# Patient Record
Sex: Male | Born: 1935 | Race: White | Hispanic: No | Marital: Married | Smoking: Former smoker
Health system: Southern US, Community
[De-identification: ages and names within clinical notes are randomized; demographics above are authoritative.]

## PROBLEM LIST (undated history)

## (undated) DIAGNOSIS — J449 Chronic obstructive pulmonary disease, unspecified: Secondary | ICD-10-CM

## (undated) DIAGNOSIS — I10 Essential (primary) hypertension: Secondary | ICD-10-CM

## (undated) DIAGNOSIS — E119 Type 2 diabetes mellitus without complications: Secondary | ICD-10-CM

---

## 2015-04-27 ENCOUNTER — Other Ambulatory Visit: Payer: Self-pay | Admitting: Nephrology

## 2015-04-27 DIAGNOSIS — N183 Chronic kidney disease, stage 3 unspecified: Secondary | ICD-10-CM

## 2015-04-27 DIAGNOSIS — N179 Acute kidney failure, unspecified: Secondary | ICD-10-CM

## 2015-05-04 ENCOUNTER — Ambulatory Visit: Payer: Self-pay

## 2018-05-19 ENCOUNTER — Other Ambulatory Visit: Payer: Self-pay

## 2018-05-19 ENCOUNTER — Emergency Department (HOSPITAL_COMMUNITY): Payer: Medicare Other

## 2018-05-19 ENCOUNTER — Inpatient Hospital Stay (HOSPITAL_COMMUNITY)
Admission: EM | Admit: 2018-05-19 | Discharge: 2018-05-24 | DRG: 291 | Disposition: A | Discharge status: Hospice | Payer: Medicare Other | Attending: Internal Medicine | Admitting: Internal Medicine

## 2018-05-19 ENCOUNTER — Inpatient Hospital Stay (HOSPITAL_COMMUNITY): Payer: Medicare Other

## 2018-05-19 ENCOUNTER — Encounter (HOSPITAL_COMMUNITY): Payer: Self-pay | Admitting: Adult Health

## 2018-05-19 DIAGNOSIS — E872 Acidosis, unspecified: Secondary | ICD-10-CM | POA: Diagnosis present

## 2018-05-19 DIAGNOSIS — Z993 Dependence on wheelchair: Secondary | ICD-10-CM | POA: Diagnosis not present

## 2018-05-19 DIAGNOSIS — J9622 Acute and chronic respiratory failure with hypercapnia: Secondary | ICD-10-CM | POA: Diagnosis present

## 2018-05-19 DIAGNOSIS — J9601 Acute respiratory failure with hypoxia: Secondary | ICD-10-CM

## 2018-05-19 DIAGNOSIS — E875 Hyperkalemia: Secondary | ICD-10-CM | POA: Diagnosis present

## 2018-05-19 DIAGNOSIS — J810 Acute pulmonary edema: Secondary | ICD-10-CM | POA: Diagnosis not present

## 2018-05-19 DIAGNOSIS — Z66 Do not resuscitate: Secondary | ICD-10-CM | POA: Diagnosis present

## 2018-05-19 DIAGNOSIS — Z23 Encounter for immunization: Secondary | ICD-10-CM

## 2018-05-19 DIAGNOSIS — J811 Chronic pulmonary edema: Secondary | ICD-10-CM

## 2018-05-19 DIAGNOSIS — I5043 Acute on chronic combined systolic (congestive) and diastolic (congestive) heart failure: Secondary | ICD-10-CM | POA: Diagnosis present

## 2018-05-19 DIAGNOSIS — N19 Unspecified kidney failure: Secondary | ICD-10-CM

## 2018-05-19 DIAGNOSIS — J9621 Acute and chronic respiratory failure with hypoxia: Secondary | ICD-10-CM | POA: Diagnosis present

## 2018-05-19 DIAGNOSIS — I2721 Secondary pulmonary arterial hypertension: Secondary | ICD-10-CM | POA: Diagnosis present

## 2018-05-19 DIAGNOSIS — K7290 Hepatic failure, unspecified without coma: Secondary | ICD-10-CM | POA: Diagnosis present

## 2018-05-19 DIAGNOSIS — Z7982 Long term (current) use of aspirin: Secondary | ICD-10-CM

## 2018-05-19 DIAGNOSIS — Z7189 Other specified counseling: Secondary | ICD-10-CM | POA: Diagnosis not present

## 2018-05-19 DIAGNOSIS — Z79899 Other long term (current) drug therapy: Secondary | ICD-10-CM

## 2018-05-19 DIAGNOSIS — R748 Abnormal levels of other serum enzymes: Secondary | ICD-10-CM | POA: Diagnosis not present

## 2018-05-19 DIAGNOSIS — Z87891 Personal history of nicotine dependence: Secondary | ICD-10-CM | POA: Diagnosis not present

## 2018-05-19 DIAGNOSIS — I2781 Cor pulmonale (chronic): Secondary | ICD-10-CM | POA: Diagnosis present

## 2018-05-19 DIAGNOSIS — E1122 Type 2 diabetes mellitus with diabetic chronic kidney disease: Secondary | ICD-10-CM | POA: Diagnosis present

## 2018-05-19 DIAGNOSIS — I5033 Acute on chronic diastolic (congestive) heart failure: Secondary | ICD-10-CM | POA: Diagnosis present

## 2018-05-19 DIAGNOSIS — E785 Hyperlipidemia, unspecified: Secondary | ICD-10-CM | POA: Diagnosis present

## 2018-05-19 DIAGNOSIS — J90 Pleural effusion, not elsewhere classified: Secondary | ICD-10-CM | POA: Diagnosis not present

## 2018-05-19 DIAGNOSIS — I130 Hypertensive heart and chronic kidney disease with heart failure and stage 1 through stage 4 chronic kidney disease, or unspecified chronic kidney disease: Secondary | ICD-10-CM | POA: Diagnosis present

## 2018-05-19 DIAGNOSIS — N183 Chronic kidney disease, stage 3 (moderate): Secondary | ICD-10-CM | POA: Diagnosis present

## 2018-05-19 DIAGNOSIS — Z7951 Long term (current) use of inhaled steroids: Secondary | ICD-10-CM

## 2018-05-19 DIAGNOSIS — J449 Chronic obstructive pulmonary disease, unspecified: Secondary | ICD-10-CM | POA: Diagnosis present

## 2018-05-19 DIAGNOSIS — Z515 Encounter for palliative care: Secondary | ICD-10-CM | POA: Diagnosis present

## 2018-05-19 DIAGNOSIS — Z9981 Dependence on supplemental oxygen: Secondary | ICD-10-CM

## 2018-05-19 DIAGNOSIS — Z9889 Other specified postprocedural states: Secondary | ICD-10-CM | POA: Diagnosis not present

## 2018-05-19 DIAGNOSIS — E119 Type 2 diabetes mellitus without complications: Secondary | ICD-10-CM

## 2018-05-19 DIAGNOSIS — M419 Scoliosis, unspecified: Secondary | ICD-10-CM | POA: Diagnosis present

## 2018-05-19 DIAGNOSIS — E118 Type 2 diabetes mellitus with unspecified complications: Secondary | ICD-10-CM | POA: Diagnosis not present

## 2018-05-19 DIAGNOSIS — N179 Acute kidney failure, unspecified: Secondary | ICD-10-CM | POA: Diagnosis present

## 2018-05-19 DIAGNOSIS — J918 Pleural effusion in other conditions classified elsewhere: Secondary | ICD-10-CM | POA: Diagnosis present

## 2018-05-19 DIAGNOSIS — I5023 Acute on chronic systolic (congestive) heart failure: Secondary | ICD-10-CM | POA: Diagnosis not present

## 2018-05-19 DIAGNOSIS — R7989 Other specified abnormal findings of blood chemistry: Secondary | ICD-10-CM | POA: Diagnosis present

## 2018-05-19 DIAGNOSIS — R188 Other ascites: Secondary | ICD-10-CM | POA: Diagnosis present

## 2018-05-19 DIAGNOSIS — Z7984 Long term (current) use of oral hypoglycemic drugs: Secondary | ICD-10-CM

## 2018-05-19 DIAGNOSIS — Z9119 Patient's noncompliance with other medical treatment and regimen: Secondary | ICD-10-CM

## 2018-05-19 DIAGNOSIS — I10 Essential (primary) hypertension: Secondary | ICD-10-CM | POA: Diagnosis not present

## 2018-05-19 DIAGNOSIS — R0603 Acute respiratory distress: Secondary | ICD-10-CM

## 2018-05-19 DIAGNOSIS — R778 Other specified abnormalities of plasma proteins: Secondary | ICD-10-CM | POA: Diagnosis present

## 2018-05-19 HISTORY — DX: Essential (primary) hypertension: I10

## 2018-05-19 HISTORY — DX: Type 2 diabetes mellitus without complications: E11.9

## 2018-05-19 HISTORY — DX: Chronic obstructive pulmonary disease, unspecified: J44.9

## 2018-05-19 LAB — CBC WITH DIFFERENTIAL/PLATELET
BAND NEUTROPHILS: 0 %
BASOS ABS: 0 10*3/uL (ref 0.0–0.1)
BLASTS: 0 %
Basophils Relative: 0 %
Eosinophils Absolute: 0 10*3/uL (ref 0.0–0.7)
Eosinophils Relative: 0 %
HEMATOCRIT: 43.4 % (ref 39.0–52.0)
Hemoglobin: 12.4 g/dL — ABNORMAL LOW (ref 13.0–17.0)
LYMPHS ABS: 1 10*3/uL (ref 0.7–4.0)
LYMPHS PCT: 7 %
MCH: 19.7 pg — AB (ref 26.0–34.0)
MCHC: 28.6 g/dL — ABNORMAL LOW (ref 30.0–36.0)
MCV: 68.8 fL — AB (ref 78.0–100.0)
METAMYELOCYTES PCT: 0 %
MONOS PCT: 1 %
Monocytes Absolute: 0.1 10*3/uL (ref 0.1–1.0)
Myelocytes: 0 %
Neutro Abs: 13.2 10*3/uL — ABNORMAL HIGH (ref 1.7–7.7)
Neutrophils Relative %: 92 %
PROMYELOCYTES RELATIVE: 0 %
Platelets: 252 10*3/uL (ref 150–400)
RBC: 6.31 MIL/uL — AB (ref 4.22–5.81)
RDW: 20.8 % — ABNORMAL HIGH (ref 11.5–15.5)
WBC: 14.3 10*3/uL — AB (ref 4.0–10.5)
nRBC: 0 /100 WBC

## 2018-05-19 LAB — I-STAT CG4 LACTIC ACID, ED: LACTIC ACID, VENOUS: 6.52 mmol/L — AB (ref 0.5–1.9)

## 2018-05-19 LAB — COMPREHENSIVE METABOLIC PANEL
ALK PHOS: 55 U/L (ref 38–126)
ALT: 18 U/L (ref 0–44)
AST: 24 U/L (ref 15–41)
Albumin: 3.8 g/dL (ref 3.5–5.0)
Anion gap: 17 — ABNORMAL HIGH (ref 5–15)
BILIRUBIN TOTAL: 0.9 mg/dL (ref 0.3–1.2)
BUN: 56 mg/dL — AB (ref 8–23)
CALCIUM: 8.8 mg/dL — AB (ref 8.9–10.3)
CO2: 20 mmol/L — ABNORMAL LOW (ref 22–32)
CREATININE: 1.76 mg/dL — AB (ref 0.61–1.24)
Chloride: 99 mmol/L (ref 98–111)
GFR, EST AFRICAN AMERICAN: 41 mL/min — AB (ref >60)
GFR, EST NON AFRICAN AMERICAN: 35 mL/min — AB (ref >60)
Glucose, Bld: 142 mg/dL — ABNORMAL HIGH (ref 70–99)
Potassium: 5.3 mmol/L — ABNORMAL HIGH (ref 3.5–5.1)
Sodium: 136 mmol/L (ref 135–145)
TOTAL PROTEIN: 6.5 g/dL (ref 6.5–8.1)

## 2018-05-19 LAB — GLUCOSE, CAPILLARY: GLUCOSE-CAPILLARY: 142 mg/dL — AB (ref 70–99)

## 2018-05-19 LAB — TROPONIN I
TROPONIN I: 0.03 ng/mL — AB (ref <0.03)
Troponin I: 0.03 ng/mL (ref <0.03)

## 2018-05-19 LAB — URINALYSIS, ROUTINE W REFLEX MICROSCOPIC
BILIRUBIN URINE: NEGATIVE
Glucose, UA: NEGATIVE mg/dL
HGB URINE DIPSTICK: NEGATIVE
KETONES UR: NEGATIVE mg/dL
Leukocytes, UA: NEGATIVE
NITRITE: NEGATIVE
Protein, ur: NEGATIVE mg/dL
SPECIFIC GRAVITY, URINE: 1.01 (ref 1.005–1.030)
pH: 5 (ref 5.0–8.0)

## 2018-05-19 LAB — BRAIN NATRIURETIC PEPTIDE: B Natriuretic Peptide: 1047 pg/mL — ABNORMAL HIGH (ref 0.0–100.0)

## 2018-05-19 LAB — LACTIC ACID, PLASMA: Lactic Acid, Venous: 4.8 mmol/L (ref 0.5–1.9)

## 2018-05-19 LAB — CBG MONITORING, ED: GLUCOSE-CAPILLARY: 147 mg/dL — AB (ref 70–99)

## 2018-05-19 LAB — PROCALCITONIN

## 2018-05-19 MED ORDER — METHYLPREDNISOLONE SODIUM SUCC 125 MG IJ SOLR
125.0000 mg | Freq: Once | INTRAMUSCULAR | Status: AC
  Administered 2018-05-19: 125 mg via INTRAVENOUS
  Filled 2018-05-19: qty 2

## 2018-05-19 MED ORDER — SODIUM CHLORIDE 0.9 % IV SOLN
INTRAVENOUS | Status: AC
  Filled 2018-05-19: qty 2

## 2018-05-19 MED ORDER — FUROSEMIDE 10 MG/ML IJ SOLN
INTRAMUSCULAR | Status: AC
  Filled 2018-05-19: qty 4

## 2018-05-19 MED ORDER — SODIUM CHLORIDE 0.9 % IV SOLN
2.0000 g | INTRAVENOUS | Status: DC
  Filled 2018-05-19: qty 2

## 2018-05-19 MED ORDER — METHYLPREDNISOLONE SODIUM SUCC 40 MG IJ SOLR: 40.0000 mg | Freq: Two times a day (BID) | INTRAMUSCULAR | Status: DC

## 2018-05-19 MED ORDER — CEFEPIME HCL 2 G IJ SOLR
2.0000 g | Freq: Once | INTRAMUSCULAR | Status: AC
  Administered 2018-05-19: 2 g via INTRAVENOUS
  Filled 2018-05-19: qty 2

## 2018-05-19 MED ORDER — PRAVASTATIN SODIUM 10 MG PO TABS
20.0000 mg | ORAL_TABLET | Freq: Every morning | ORAL | Status: DC
  Administered 2018-05-20 – 2018-05-24 (×5): 20 mg via ORAL
  Filled 2018-05-19: qty 2
  Filled 2018-05-19: qty 1
  Filled 2018-05-19 (×2): qty 2
  Filled 2018-05-19: qty 1
  Filled 2018-05-19 (×2): qty 2

## 2018-05-19 MED ORDER — SODIUM CHLORIDE 0.9 % IV BOLUS: 1000.0000 mL | Freq: Once | INTRAVENOUS | Status: DC

## 2018-05-19 MED ORDER — ONDANSETRON HCL 4 MG/2ML IJ SOLN: 4.0000 mg | Freq: Four times a day (QID) | INTRAMUSCULAR | Status: DC | PRN

## 2018-05-19 MED ORDER — ALBUTEROL (5 MG/ML) CONTINUOUS INHALATION SOLN
10.0000 mg/h | INHALATION_SOLUTION | RESPIRATORY_TRACT | Status: DC
Start: 2018-05-19 — End: 2018-05-19
  Administered 2018-05-19: 10 mg/h via RESPIRATORY_TRACT
  Filled 2018-05-19: qty 20

## 2018-05-19 MED ORDER — FUROSEMIDE 10 MG/ML IJ SOLN
40.0000 mg | INTRAMUSCULAR | Status: AC
  Administered 2018-05-19: 40 mg via INTRAVENOUS
  Filled 2018-05-19: qty 4

## 2018-05-19 MED ORDER — TRAMADOL HCL 50 MG PO TABS
100.0000 mg | ORAL_TABLET | Freq: Every evening | ORAL | Status: DC
  Filled 2018-05-19: qty 2

## 2018-05-19 MED ORDER — SODIUM CHLORIDE 0.9% FLUSH: 3.0000 mL | INTRAVENOUS | Status: DC | PRN

## 2018-05-19 MED ORDER — IPRATROPIUM-ALBUTEROL 0.5-2.5 (3) MG/3ML IN SOLN
3.0000 mL | Freq: Four times a day (QID) | RESPIRATORY_TRACT | Status: DC
  Administered 2018-05-20 – 2018-05-24 (×17): 3 mL via RESPIRATORY_TRACT
  Filled 2018-05-19 (×17): qty 3

## 2018-05-19 MED ORDER — VANCOMYCIN HCL 10 G IV SOLR
1250.0000 mg | INTRAVENOUS | Status: DC
  Filled 2018-05-19: qty 1250

## 2018-05-19 MED ORDER — GABAPENTIN 300 MG PO CAPS
300.0000 mg | ORAL_CAPSULE | Freq: Two times a day (BID) | ORAL | Status: DC
Start: 2018-05-19 — End: 2018-05-24
  Administered 2018-05-19 – 2018-05-24 (×10): 300 mg via ORAL
  Filled 2018-05-19 (×10): qty 1

## 2018-05-19 MED ORDER — INSULIN ASPART 100 UNIT/ML ~~LOC~~ SOLN: 0.0000 [IU] | Freq: Every day | SUBCUTANEOUS | Status: DC

## 2018-05-19 MED ORDER — PNEUMOCOCCAL VAC POLYVALENT 25 MCG/0.5ML IJ INJ
0.5000 mL | INJECTION | INTRAMUSCULAR | Status: AC
  Administered 2018-05-20: 0.5 mL via INTRAMUSCULAR
  Filled 2018-05-19: qty 0.5

## 2018-05-19 MED ORDER — ONDANSETRON HCL 4 MG PO TABS: 4.0000 mg | ORAL_TABLET | Freq: Four times a day (QID) | ORAL | Status: DC | PRN

## 2018-05-19 MED ORDER — ACETAMINOPHEN 325 MG PO TABS
650.0000 mg | ORAL_TABLET | Freq: Four times a day (QID) | ORAL | Status: DC | PRN
  Administered 2018-05-24: 650 mg via ORAL
  Filled 2018-05-19: qty 2

## 2018-05-19 MED ORDER — INSULIN ASPART 100 UNIT/ML ~~LOC~~ SOLN
0.0000 [IU] | SUBCUTANEOUS | Status: DC
  Administered 2018-05-20: 2 [IU] via SUBCUTANEOUS

## 2018-05-19 MED ORDER — INSULIN ASPART 100 UNIT/ML ~~LOC~~ SOLN: 0.0000 [IU] | SUBCUTANEOUS | Status: DC

## 2018-05-19 MED ORDER — HEPARIN SODIUM (PORCINE) 5000 UNIT/ML IJ SOLN
5000.0000 [IU] | Freq: Three times a day (TID) | INTRAMUSCULAR | Status: DC
  Administered 2018-05-19 – 2018-05-23 (×12): 5000 [IU] via SUBCUTANEOUS
  Filled 2018-05-19 (×12): qty 1

## 2018-05-19 MED ORDER — SODIUM CHLORIDE 0.9% FLUSH
3.0000 mL | Freq: Two times a day (BID) | INTRAVENOUS | Status: DC
  Administered 2018-05-20 – 2018-05-22 (×6): 3 mL via INTRAVENOUS

## 2018-05-19 MED ORDER — VANCOMYCIN HCL IN DEXTROSE 1-5 GM/200ML-% IV SOLN
1000.0000 mg | Freq: Once | INTRAVENOUS | Status: AC
  Administered 2018-05-19: 1000 mg via INTRAVENOUS
  Filled 2018-05-19: qty 200

## 2018-05-19 MED ORDER — ACETAMINOPHEN 650 MG RE SUPP: 650.0000 mg | Freq: Four times a day (QID) | RECTAL | Status: DC | PRN

## 2018-05-19 MED ORDER — SODIUM CHLORIDE 0.9 % IV SOLN: 250.0000 mL | INTRAVENOUS | Status: DC | PRN

## 2018-05-19 MED ORDER — ASPIRIN EC 325 MG PO TBEC
325.0000 mg | DELAYED_RELEASE_TABLET | Freq: Every day | ORAL | Status: DC
  Administered 2018-05-19 – 2018-05-23 (×5): 325 mg via ORAL
  Filled 2018-05-19 (×5): qty 1

## 2018-05-19 MED ORDER — FUROSEMIDE 10 MG/ML IJ SOLN
80.0000 mg | Freq: Two times a day (BID) | INTRAMUSCULAR | Status: DC
Start: 2018-05-20 — End: 2018-05-21
  Administered 2018-05-20 – 2018-05-21 (×2): 80 mg via INTRAVENOUS
  Filled 2018-05-19 (×4): qty 8

## 2018-05-19 MED ORDER — FUROSEMIDE 10 MG/ML IJ SOLN
40.0000 mg | Freq: Once | INTRAMUSCULAR | Status: AC
  Administered 2018-05-19: 40 mg via INTRAVENOUS

## 2018-05-19 MED ORDER — BUDESONIDE 0.25 MG/2ML IN SUSP
0.2500 mg | Freq: Two times a day (BID) | RESPIRATORY_TRACT | Status: DC
  Administered 2018-05-20 – 2018-05-24 (×9): 0.25 mg via RESPIRATORY_TRACT
  Filled 2018-05-19 (×9): qty 2

## 2018-05-19 NOTE — H&P (Addendum)
History and Physical    Chad Richard ZOX:[id number] DOB: October 09, 1936 DOA: 05/19/2018  [facility]   Patient coming from: Home  Chief Complaint: Dyspnea/hypoxemia  HPI: Chad Richard is a 82 y.o. male with medical history significant for COPD with prior tobacco use on home oxygen, questionable history of CHF, scoliosis and wheelchair-bound, diabetes, hypertension, and dyslipidemia who was brought to the ED by family members due to worsening shortness of breath and hypoxemia that appears to be ongoing since his prior hospitalization in [place] near the end of June.  At that time, reportedly he had bilateral thoracentesis with significant relief and apparently left AMA.  He has had worsening oxygen requirements and is now on 10 L nasal cannula at home consistently.  He cannot functionally do much of anything at home and was recently prescribed a steroid taper pack as well as doxycycline by his primary care provider and he is currently on the sixth day of treatment.  He is also been using home nebulizers along with these medications with no relief at all. He denies any chest pain, cough, hemoptysis, lower extremity edema, orthopnea, paroxysmal nocturnal dyspnea, fever, or chills.   ED Course: Vital signs are stable with laboratory data demonstrating leukocytosis of 14,300 and lactic acid initially of 6.52 with 4.8 on repeat.  Hemoglobin is 12.4 and potassium 5.3.  BUN 56 and creatinine 1.76.  Troponin 0.03.  Two-view chest x-ray with pulmonary edema and bilateral pleural effusions noted.  EKG with sinus tachycardia at 90 bpm.  He has remained hypoxemic on nasal cannula and has been placed on BiPAP with FiO2 65% and appears to be tolerating this well.  He has received 1 dose of Lasix as well as IV steroids and breathing treatments along with initiation of vancomycin and cefepime.  Review of Systems: All others reviewed and otherwise negative.  Past Medical History:    Diagnosis Date  . COPD (chronic obstructive pulmonary disease) (HCC)   . Diabetes mellitus without complication (HCC)   . Hypertension     History reviewed. No pertinent surgical history.   reports that he has quit smoking. His smoking use included cigarettes. He does not have any smokeless tobacco history on file. He reports that he does not drink alcohol or use drugs.  No Known Allergies  History reviewed. No pertinent family history.  Prior to Admission medications   Medication Sig Start Date End Date Taking? Authorizing Provider  albuterol (PROAIR HFA) 108 (90 Base) MCG/ACT inhaler Inhale 1-2 puffs into the lungs every 6 (six) hours as needed for wheezing or shortness of breath.   Yes [provider]  albuterol (PROVENTIL) (2.5 MG/3ML) 0.083% nebulizer solution Take 2.5 mg by nebulization every 4 (four) hours.   Yes [provider]  amLODipine (NORVASC) 10 MG tablet Take 10 mg by mouth every morning.   Yes [provider]  aspirin EC 325 MG tablet Take 325 mg by mouth at bedtime.   Yes [provider]  Cetirizine-Pseudoephedrine (ALLERGY RELIEF/NASAL DECONGEST PO) Take 2 sprays by mouth every evening.   Yes [provider]  diphenhydrAMINE (BENADRYL) 25 MG tablet Take 50 mg by mouth at bedtime as needed for sleep.   Yes [provider]  doxycycline (VIBRAMYCIN) 100 MG capsule Take 100 mg by mouth every 12 (twelve) hours. 10 day course starting on 05/14/18   Yes [provider]  fluticasone furoate-vilanterol (BREO ELLIPTA) 200-25 MCG/INH AEPB Inhale 1 puff into the lungs every evening.  Yes [provider]  furosemide (LASIX) 40 MG tablet Take 40 mg by mouth every morning.   Yes [provider]  gabapentin (NEURONTIN) 300 MG capsule Take 300 mg by mouth 2 (two) times daily.   Yes [provider]  Melatonin 10 MG CAPS Take 10 mg by mouth at bedtime as needed (for sleep).   Yes [provider]  metFORMIN (GLUCOPHAGE) 1000 MG tablet Take 1,000 mg by mouth 2 (two) times daily with a meal.   Yes [provider]  pravastatin (PRAVACHOL) 20 MG tablet Take 20 mg by mouth every morning.   Yes [provider]  predniSONE (DELTASONE) 20 MG tablet Take 20 mg by mouth See admin instructions. Starting on 05/14/2018 Take 3 tablets by mouth once, then take one tablet twice daily for 5 days, then stop   Yes [provider]  traMADol (ULTRAM) 50 MG tablet Take 100 mg by mouth every evening. *May take 100mg  every 8 hours as needed for pain   Yes [provider]    Physical Exam: Vitals:   05/19/18 1954 05/19/18 2000 05/19/18 2030 05/19/18 2046  BP: 126/78 115/79 117/69   Pulse: 92 90 90   Resp: 16 10 (!) 23   Temp:      TempSrc:      SpO2: 92% 96% 100% 99%  Weight:      Height:        Constitutional: Minimal distress and currently comfortable on BiPAP. Vitals:   05/19/18 1954 05/19/18 2000 05/19/18 2030 05/19/18 2046  BP: 126/78 115/79 117/69   Pulse: 92 90 90   Resp: 16 10 (!) 23   Temp:      TempSrc:      SpO2: 92% 96% 100% 99%  Weight:      Height:       Eyes: lids and conjunctivae normal ENMT: Mucous membranes are moist.  Neck: normal, supple Respiratory: clear to auscultation bilaterally. Normal respiratory effort. No accessory muscle use.  On BiPAP with FiO2 65%. Cardiovascular: Regular rate and rhythm, no murmurs. No extremity edema. Abdomen: no tenderness, no distention. Bowel sounds positive.  Musculoskeletal:  No joint deformity upper and lower extremities.   Skin: no rashes, lesions, ulcers.   Labs on Admission: I have personally reviewed following labs and imaging studies  CBC: Recent Labs  Lab 05/19/18 1806  WBC 14.3*  NEUTROABS 13.2*  HGB 12.4*  HCT 43.4  MCV 68.8*  PLT 252   Basic Metabolic Panel: Recent Labs  Lab 05/19/18 1806  NA 136  K 5.3*  CL 99  CO2 20*  GLUCOSE 142*  BUN 56*  CREATININE  1.76*  CALCIUM 8.8*   GFR: Estimated Creatinine Clearance: 39.6 mL/min (A) (by C-G formula based on SCr of 1.76 mg/dL (H)). Liver Function Tests: Recent Labs  Lab 05/19/18 1806  AST 24  ALT 18  ALKPHOS 55  BILITOT 0.9  PROT 6.5  ALBUMIN 3.8   No results for input(s): LIPASE, AMYLASE in the last 168 hours. No results for input(s): AMMONIA in the last 168 hours. Coagulation Profile: No results for input(s): INR, PROTIME in the last 168 hours. Cardiac Enzymes: Recent Labs  Lab 05/19/18 1806  TROPONINI 0.03*   BNP (last 3 results) No results for input(s): PROBNP in the last 8760 hours. HbA1C: No results for input(s): HGBA1C in the last 72 hours. CBG: No results for input(s): GLUCAP in the last 168 hours. Lipid Profile: No results for input(s): CHOL, HDL, LDLCALC,  TRIG, CHOLHDL, LDLDIRECT in the last 72 hours. Thyroid Function Tests: No results for input(s): TSH, T4TOTAL, FREET4, T3FREE, THYROIDAB in the last 72 hours. Anemia Panel: No results for input(s): VITAMINB12, FOLATE, FERRITIN, TIBC, IRON, RETICCTPCT in the last 72 hours. Urine analysis: No results found for: COLORURINE, APPEARANCEUR, LABSPEC, PHURINE, GLUCOSEU, HGBUR, BILIRUBINUR, KETONESUR, PROTEINUR, UROBILINOGEN, NITRITE, LEUKOCYTESUR  Radiological Exams on Admission: Dg Chest 2 View  Result Date: 05/19/2018 CLINICAL DATA:  82 year old male with a history of shortness of breath EXAM: CHEST - 2 VIEW COMPARISON:  None. FINDINGS: Cardiomediastinal silhouette borderline enlarged. Evidence of central vascular congestion. Interlobular septal thickening bilaterally. Blunting of the left costophrenic angle and right costophrenic angle. Meniscus on the lateral view within the costophrenic sulcus. No displaced fracture. IMPRESSION: Evidence of pulmonary edema and small bilateral pleural effusions. Electronically Signed   By: Gilmer Mor D.O.   On: 05/19/2018 19:30    EKG: Independently reviewed. NSR with low voltage  90bpm.  Assessment/Plan Principal Problem:   Acute hypoxemic respiratory failure (HCC) Active Problems:   CHF exacerbation (HCC)   Lactic acidosis   COPD (chronic obstructive pulmonary disease) (HCC)   Essential hypertension   Diabetes (HCC)   Dyslipidemia   AKI (acute kidney injury) (HCC)   Hyperkalemia   Elevated troponin    1. Acute hypoxemic respiratory failure likely secondary to cardiogenic pulmonary edema.  Maintain on BiPAP and monitor in stepdown unit.  Order chest CT for now for further evaluation and consider VQ scan due to poor renal function to evaluate for pulmonary embolus if there appear to be no significant findings to explain severe hypoxemia. Recheck am abg. 2. Acute CHF exacerbation.  Lasix 80 mg IV twice daily as he does not appear to have responded well to 40 mg of IV Lasix in the ED.  Patient reportedly is compliant with his home oral Lasix of 40 mg daily but has been having poor urine output.  Continue to monitor ins and outs and daily weights.  2D echocardiogram for further evaluation.  Patient does not appear to be on any beta-blockers and will hold ACE inhibitors due to renal dysfunction.  Patient has apparently recently had a cardiac catheterization at [facility].  Will order chart for review. 3. Lactic acidosis-improving.  Patient does have some mild leukocytosis but this is likely secondary to his recent steroid taper use.  He currently has no clinical signs or symptoms of infection and no other significant SIRs criteria.  We will continue vancomycin and cefepime empirically for now but he does not appear to have pneumonia.  Blood cultures have been obtained in ED, however patient has been on doxycycline over the last 1 week.  Will order procalcitonin with repeat lactic acid in a.m.  Hold IV fluid for now as this is likely related to CHF exacerbation.  Order UA and urine culture when patient produces urine. 4. AKI versus CKD.  Continue to monitor with repeat  renal panel in a.m. and avoid nephrotoxic agents aside from diuresis.  Monitor strict input and outputs and consider nephrology consultation should patient have poor urine output despite diuretics as ordered. 5. Elevated troponin.  Likely due to demand from above processes.  Continue to trend.  Check 2D echocardiogram. 6. Hyperkalemia.  Will maintain on Lasix for now and recheck renal panel.  Monitor on telemetry. 7. COPD on home intermittent oxygen.  He does not appear to be in any acute COPD exacerbation, but will place on IV steroids at 60 mg every 6  hours empirically as well as duo nebs and Pulmicort. 8. Type 2 diabetes.  Hold home metformin and place on sliding scale insulin. 9. Essential hypertension.  Hold home amlodipine at this time due to plans for aggressive diuresis and borderline blood pressure readings. 10. Dyslipidemia.  Continue statin.  Addendum: Patient noted to have moderate right-sided pleural effusion and small left pleural effusion.  Will order ultrasound guided thoracentesis of right-sided effusion for a.m.  DVT prophylaxis: Heparin Code Status: Full Family Communication: 2 sons, daughter, and wife at bedside Disposition Plan:Further evaluation of dyspnea/hypoxemia with initial empiric treatment Consults called:None Admission status: Inpatient, SDU   Chad Richard Hoover Brunette DO [facility] Pager [phone]  If 7PM-7AM, please contact night-coverage [email or web address] Password [email or web address]  05/19/2018, 9:02 PM

## 2018-05-19 NOTE — Progress Notes (Signed)
Patient transported to CT then to ICU bed 7 on BIPAP. Unit plugged into red outlet and O2 hooked up to wall upon arrival to ICU. No complications noted. Report given to K. Wyatt RRT,RCP.

## 2018-05-19 NOTE — Progress Notes (Signed)
Pharmacy Antibiotic Note  Chad Richard is a 82 y.o. male admitted on 05/19/2018 with sepsis.  Pharmacy has been consulted for Vancomycin and Cefepime dosing.  Initial doses given in ED.  Plan: Vancomycin 1250mg  IV every 24 hours.  Goal trough 15-20 mcg/mL.  Cefepime 2gm IV every 24 hours. Monitor labs, micro and vitals.   Height: 6\' 2"  (188 cm) Weight: 200 lb (90.7 kg) IBW/kg (Calculated) : 82.2  Temp (24hrs), Avg:98 F (36.7 C), Min:98 F (36.7 C), Max:98 F (36.7 C)  Recent Labs  Lab 05/19/18 1806 05/19/18 1811 05/19/18 1951  WBC 14.3*  --   --   CREATININE 1.76*  --   --   LATICACIDVEN  --  6.52* 4.8*    Estimated Creatinine Clearance: 39.6 mL/min (A) (by C-G formula based on SCr of 1.76 mg/dL (H)).    No Known Allergies  Antimicrobials this admission: Cefepime 7/17 >>  Vanc 7/17 >>   Dose adjustments this admission: n/a   Microbiology results: 7/17 BCx: pending 7/17 UCx: pending   Sputum:    MRSA PCR:   Thank you for allowing pharmacy to be a part of this patient's care.  Mady GemmaHayes, Llesenia Fogal R 05/19/2018 9:47 PM

## 2018-05-19 NOTE — ED Notes (Signed)
Dr. Hyacinth MeekerMiller advised of critical lactic

## 2018-05-19 NOTE — ED Provider Notes (Signed)
[facility] EMERGENCY DEPARTMENT Provider Note   CSN: [id number] Arrival date & time: 05/19/18  1739     History   Chief Complaint Chief Complaint  Patient presents with  . Shortness of Breath    HPI Chad Richard is a 82 y.o. male.  HPI  The patient is a 82 year old male who reportedly has a history of long-time tobacco use, long-time COPD, was found to be hypoxic at approximately 70% O2, has had worsening shortness of breath over the last several days. He does endorse having a chronic cough but denies fevers or significant swelling of the legs. He is very immobile secondary to his spinal scoliosis and advanced disease as well as his liver failure and recurrent ascites. He does get recurrent paracentesis for that. He reports that the shortness of breath is severe today prompting his evaluation. Paramedics found him to be severely hypoxic and gave 3 doses ofalbuterol with ipratropium in route with some improvement however his oxygen on arrival was 75%.  The patient endorses having prior admissions to the hospital for shortness of breath and COPD. He denies history of blood clot, denies frank chest discomfort at this time.  Past Medical History:  Diagnosis Date  . COPD (chronic obstructive pulmonary disease) (HCC)   . Diabetes mellitus without complication (HCC)   . Hypertension     There are no active problems to display for this patient.   History reviewed. No pertinent surgical history.      Home Medications    Prior to Admission medications   Medication Sig Start Date End Date Taking? Authorizing Provider  albuterol (PROAIR HFA) 108 (90 Base) MCG/ACT inhaler Inhale 1-2 puffs into the lungs every 6 (six) hours as needed for wheezing or shortness of breath.   Yes [provider]  albuterol (PROVENTIL) (2.5 MG/3ML) 0.083% nebulizer solution Take 2.5 mg by nebulization every 4 (four) hours.   Yes [provider]  amLODipine (NORVASC) 10 MG tablet Take 10  mg by mouth every morning.   Yes [provider]  aspirin EC 325 MG tablet Take 325 mg by mouth at bedtime.   Yes [provider]  Cetirizine-Pseudoephedrine (ALLERGY RELIEF/NASAL DECONGEST PO) Take 2 sprays by mouth every evening.   Yes [provider]  diphenhydrAMINE (BENADRYL) 25 MG tablet Take 50 mg by mouth at bedtime as needed for sleep.   Yes [provider]  doxycycline (VIBRAMYCIN) 100 MG capsule Take 100 mg by mouth every 12 (twelve) hours. 10 day course starting on 05/14/18   Yes [provider]  fluticasone furoate-vilanterol (BREO ELLIPTA) 200-25 MCG/INH AEPB Inhale 1 puff into the lungs every evening.   Yes [provider]  furosemide (LASIX) 40 MG tablet Take 40 mg by mouth every morning.   Yes [provider]  gabapentin (NEURONTIN) 300 MG capsule Take 300 mg by mouth 2 (two) times daily.   Yes [provider]  Melatonin 10 MG CAPS Take 10 mg by mouth at bedtime as needed (for sleep).   Yes [provider]  metFORMIN (GLUCOPHAGE) 1000 MG tablet Take 1,000 mg by mouth 2 (two) times daily with a meal.   Yes [provider]  pravastatin (PRAVACHOL) 20 MG tablet Take 20 mg by mouth every morning.   Yes [provider]  predniSONE (DELTASONE) 20 MG tablet Take 20 mg by mouth See admin instructions. Starting on 05/14/2018 Take 3 tablets by mouth once, then take one tablet twice daily for 5 days, then stop  Yes [provider]  traMADol (ULTRAM) 50 MG tablet Take 100 mg by mouth every evening. *May take 100mg  every 8 hours as needed for pain   Yes [provider]    Family History History reviewed. No pertinent family history.  Social History Social History   Tobacco Use  . Smoking status: Former Smoker    Types: Cigarettes  Substance Use Topics  . Alcohol use: Never    Frequency: Never  . Drug use: Never     Allergies   Patient has no known  allergies.   Review of Systems Review of Systems  All other systems reviewed and are negative.    Physical Exam Updated Vital Signs BP 115/79   Pulse 90   Temp 98 F (36.7 C) (Oral)   Resp 10   Ht 6\' 2"  (1.88 m)   Wt 90.7 kg (200 lb)   SpO2 96%   BMI 25.68 kg/m   Physical Exam  Constitutional: He appears well-developed and well-nourished. He appears ill. He appears distressed.  HENT:  Head: Normocephalic and atraumatic.  Mouth/Throat: Oropharynx is clear and moist. No oropharyngeal exudate.  MM look dry  Eyes: Pupils are equal, round, and reactive to light. Conjunctivae and EOM are normal. Right eye exhibits no discharge. Left eye exhibits no discharge. No scleral icterus.  Neck: Normal range of motion. Neck supple. No JVD present. No thyromegaly present.  Cardiovascular: Normal rate, regular rhythm, normal heart sounds and intact distal pulses. Exam reveals no gallop and no friction rub.  No murmur heard. Pulmonary/Chest: He is in respiratory distress. He has wheezes. He has no rales.  Lungs are diminished bilaterally - no obvious rales or wheezing, he is tachypneic and has some increased WOB  Abdominal: Soft. Bowel sounds are normal. He exhibits no distension and no mass. There is no tenderness.  Full abdomen - dull to percussion - no fluid wave, no ttp  Musculoskeletal: Normal range of motion. He exhibits edema ( bilatearl LE edema - leg length discrepancy). He exhibits no tenderness.  Lymphadenopathy:    He has no cervical adenopathy.  Neurological: He is alert. Coordination normal.  The patient is able to speak in sentences that are lucid, he has the ability to move both of his arms with normal strength. He has some difficulty with his legs secondary to weakness and chronic pain, he has no obvious facial droop or memory loss  Skin: Skin is warm and dry. No rash noted. No erythema.  Psychiatric: He has a normal mood and affect. His behavior is normal.  Nursing note and  vitals reviewed.    ED Treatments / Results  Labs (all labs ordered are listed, but only abnormal results are displayed) Labs Reviewed  BLOOD GAS, ARTERIAL - Abnormal; Notable for the following components:      Result Value   pCO2 arterial 31.7 (*)    pO2, Arterial 45.9 (*)    All other components within normal limits  CBC WITH DIFFERENTIAL/PLATELET - Abnormal; Notable for the following components:   WBC 14.3 (*)    RBC 6.31 (*)    Hemoglobin 12.4 (*)    MCV 68.8 (*)    MCH 19.7 (*)    MCHC 28.6 (*)    RDW 20.8 (*)    Neutro Abs 13.2 (*)    All other components within normal limits  COMPREHENSIVE METABOLIC PANEL - Abnormal; Notable for the following components:   Potassium 5.3 (*)    CO2 20 (*)  Glucose, Bld 142 (*)    BUN 56 (*)    Creatinine, Ser 1.76 (*)    Calcium 8.8 (*)    GFR calc non Af Amer 35 (*)    GFR calc Af Amer 41 (*)    Anion gap 17 (*)    All other components within normal limits  TROPONIN I - Abnormal; Notable for the following components:   Troponin I 0.03 (*)    All other components within normal limits  I-STAT CG4 LACTIC ACID, ED - Abnormal; Notable for the following components:   Lactic Acid, Venous 6.52 (*)    All other components within normal limits  CULTURE, BLOOD (ROUTINE X 2)  CULTURE, BLOOD (ROUTINE X 2)  URINALYSIS, ROUTINE W REFLEX MICROSCOPIC  BRAIN NATRIURETIC PEPTIDE  LACTIC ACID, PLASMA  LACTIC ACID, PLASMA  I-STAT CG4 LACTIC ACID, ED    EKG EKG Interpretation  Date/Time:  Wednesday May 19 2018 17:50:14 EDT Ventricular Rate:  93 PR Interval:    QRS Duration: 106 QT Interval:  347 QTC Calculation: 432 R Axis:   -99 Text Interpretation:  Sinus rhythm Low voltage, extremity and precordial leads Consider right ventricular hypertrophy Consider inferior infarct Abnormal lateral Q waves diffuse low voltage, no old tracing to compare Confirmed by Eber Hong ([id number]) on 05/19/2018 5:55:37 PM   Radiology Dg Chest 2  View  Result Date: 05/19/2018 CLINICAL DATA:  82 year old male with a history of shortness of breath EXAM: CHEST - 2 VIEW COMPARISON:  None. FINDINGS: Cardiomediastinal silhouette borderline enlarged. Evidence of central vascular congestion. Interlobular septal thickening bilaterally. Blunting of the left costophrenic angle and right costophrenic angle. Meniscus on the lateral view within the costophrenic sulcus. No displaced fracture. IMPRESSION: Evidence of pulmonary edema and small bilateral pleural effusions. Electronically Signed   By: Gilmer Mor D.O.   On: 05/19/2018 19:30    Procedures .Critical Care Performed by: Eber Hong, MD Authorized by: Eber Hong, MD   Critical care provider statement:    Critical care time (minutes):  35   Critical care time was exclusive of:  Separately billable procedures and treating other patients and teaching time   Critical care was necessary to treat or prevent imminent or life-threatening deterioration of the following conditions:  Respiratory failure and cardiac failure   Critical care was time spent personally by me on the following activities:  Blood draw for specimens, development of treatment plan with patient or surrogate, discussions with consultants, evaluation of patient's response to treatment, examination of patient, obtaining history from patient or surrogate, ordering and performing treatments and interventions, ordering and review of laboratory studies, ordering and review of radiographic studies, pulse oximetry, re-evaluation of patient's condition and review of old charts Comments:     The patient resented in respiratory distress with persistent hypoxia less than 80% despite high flow nasal cannula. He has evidence of CHF with pulmonary edema, small pleural effusions, low voltage on his EKG.  He has no fever, no hypotension to suggest sepsis as the source of his lactic acidosis.   (including critical care time)  Medications  Ordered in ED Medications  albuterol (PROVENTIL,VENTOLIN) solution continuous neb (10 mg/hr Nebulization New Bag/Given 05/19/18 1952)  vancomycin (VANCOCIN) IVPB 1000 mg/200 mL premix (has no administration in time range)  ceFEPIme (MAXIPIME) 2 g in sodium chloride 0.9 % 100 mL IVPB (2 g Intravenous New Bag/Given 05/19/18 2000)  sodium chloride 0.9 % with ceFEPIme (MAXIPIME) ADS Med (has no administration in time range)  sodium chloride  0.9 % bolus 1,000 mL (has no administration in time range)  methylPREDNISolone sodium succinate (SOLU-MEDROL) 125 mg/2 mL injection 125 mg (125 mg Intravenous Given 05/19/18 1958)  furosemide (LASIX) injection 40 mg (40 mg Intravenous Given 05/19/18 2012)     Initial Impression / Assessment and Plan / ED Course  I have reviewed the triage vital signs and the nursing notes.  Pertinent labs & imaging results that were available during my care of the patient were reviewed by me and considered in my medical decision making (see chart for details).    The cause of the patient's shortness of breath is noterribly clear though at this time I would suspect that this is in some way related to COPD. He reports that he has had multiple paracentesis in the past as well as thoracentesis in the past, he may have recurrent effusions at this time.  EKG is unremarkable showing only low voltage  Chest x-ray pending, labs pending.  The patient is critically ill, requiring BiPAP due to his poor oxygenation, this has improved his oxygenation significantly and he is now at 95% and much more rested. I discussed his care with Dr. Clelia Croft of the hospitalist service who will admit to a high level of care. The patient is critically ill.  Final Clinical Impressions(s) / ED Diagnoses   Final diagnoses:  Respiratory distress  Acute pulmonary edema (HCC)  Lactic acidosis  Renal failure, unspecified chronicity      Eber Hong, MD 05/19/18 2027

## 2018-05-19 NOTE — ED Notes (Signed)
Respiratory paged for Bipap

## 2018-05-19 NOTE — ED Triage Notes (Signed)
Presents with hx of severe COPD, he states he uses 10 liters of compressed oxygen at home and has been doing this for quite some time. SOB began a week ago and has worsened. He endorses abdominal swelling. Denies pedal edema. Given 3 albuterol/atrovent treatments in route by EMS. Pt has a swallow color and is cool and clammy. Bilateral inspiratory and expiratory wheezes. HE recently had 5 liters of fluid removed from abdomen 8 days ago.

## 2018-05-19 NOTE — ED Notes (Signed)
Date and time results received: 05/19/18 1921  (use smartphrase ".now" to insert current time)  Test: Troponin Critical Value: 0.03  Name of Provider Notified: Hyacinth MeekerMiller  Orders Received? Or Actions Taken?:

## 2018-05-20 ENCOUNTER — Inpatient Hospital Stay (HOSPITAL_COMMUNITY): Payer: Medicare Other

## 2018-05-20 ENCOUNTER — Encounter (HOSPITAL_COMMUNITY): Payer: Self-pay | Admitting: Primary Care

## 2018-05-20 DIAGNOSIS — Z7189 Other specified counseling: Secondary | ICD-10-CM

## 2018-05-20 DIAGNOSIS — E872 Acidosis: Secondary | ICD-10-CM

## 2018-05-20 DIAGNOSIS — E118 Type 2 diabetes mellitus with unspecified complications: Secondary | ICD-10-CM

## 2018-05-20 DIAGNOSIS — I10 Essential (primary) hypertension: Secondary | ICD-10-CM

## 2018-05-20 DIAGNOSIS — Z515 Encounter for palliative care: Secondary | ICD-10-CM

## 2018-05-20 DIAGNOSIS — J449 Chronic obstructive pulmonary disease, unspecified: Secondary | ICD-10-CM

## 2018-05-20 DIAGNOSIS — I5023 Acute on chronic systolic (congestive) heart failure: Secondary | ICD-10-CM

## 2018-05-20 LAB — CBC
HCT: 40.7 % (ref 39.0–52.0)
Hemoglobin: 11.9 g/dL — ABNORMAL LOW (ref 13.0–17.0)
MCH: 19.7 pg — AB (ref 26.0–34.0)
MCHC: 29.2 g/dL — AB (ref 30.0–36.0)
MCV: 67.3 fL — ABNORMAL LOW (ref 78.0–100.0)
PLATELETS: 225 10*3/uL (ref 150–400)
RBC: 6.05 MIL/uL — ABNORMAL HIGH (ref 4.22–5.81)
RDW: 20.8 % — ABNORMAL HIGH (ref 11.5–15.5)
WBC: 10.1 10*3/uL (ref 4.0–10.5)

## 2018-05-20 LAB — GLUCOSE, CAPILLARY
GLUCOSE-CAPILLARY: 145 mg/dL — AB (ref 70–99)
GLUCOSE-CAPILLARY: 144 mg/dL — AB (ref 70–99)
GLUCOSE-CAPILLARY: 124 mg/dL — AB (ref 70–99)
GLUCOSE-CAPILLARY: 147 mg/dL — AB (ref 70–99)
GLUCOSE-CAPILLARY: 162 mg/dL — AB (ref 70–99)

## 2018-05-20 LAB — GRAM STAIN

## 2018-05-20 LAB — BLOOD GAS, ARTERIAL
ACID-BASE EXCESS: 1.7 mmol/L (ref 0.0–2.0)
BICARBONATE: 26.3 mmol/L (ref 20.0–28.0)
DELIVERY SYSTEMS: POSITIVE
DRAWN BY: 28459
Expiratory PAP: 6
FIO2: 100
Inspiratory PAP: 12
O2 Saturation: 92.1 %
PCO2 ART: 32.4 mmHg (ref 32.0–48.0)
Patient temperature: 37
pH, Arterial: 7.496 — ABNORMAL HIGH (ref 7.350–7.450)
pO2, Arterial: 66 mmHg — ABNORMAL LOW (ref 83.0–108.0)

## 2018-05-20 LAB — BASIC METABOLIC PANEL
Anion gap: 13 (ref 5–15)
BUN: 56 mg/dL — AB (ref 8–23)
CALCIUM: 8.7 mg/dL — AB (ref 8.9–10.3)
CO2: 24 mmol/L (ref 22–32)
CREATININE: 1.62 mg/dL — AB (ref 0.61–1.24)
Chloride: 99 mmol/L (ref 98–111)
GFR calc Af Amer: 45 mL/min — ABNORMAL LOW (ref >60)
GFR calc non Af Amer: 39 mL/min — ABNORMAL LOW (ref >60)
Glucose, Bld: 168 mg/dL — ABNORMAL HIGH (ref 70–99)
Potassium: 4.8 mmol/L (ref 3.5–5.1)
SODIUM: 136 mmol/L (ref 135–145)

## 2018-05-20 LAB — BODY FLUID CELL COUNT WITH DIFFERENTIAL
Eos, Fluid: 0 %
Lymphs, Fluid: 98 %
Monocyte-Macrophage-Serous Fluid: 2 % — ABNORMAL LOW (ref 50–90)
Neutrophil Count, Fluid: 0 % (ref 0–25)
Other Cells, Fluid: REACTIVE %
Total Nucleated Cell Count, Fluid: 284 cu mm (ref 0–1000)

## 2018-05-20 LAB — GLUCOSE, PLEURAL OR PERITONEAL FLUID: Glucose, Fluid: 165 mg/dL

## 2018-05-20 LAB — HEMOGLOBIN A1C
HEMOGLOBIN A1C: 6.9 % — AB (ref 4.8–5.6)
Mean Plasma Glucose: 151.33 mg/dL

## 2018-05-20 LAB — PROTEIN, PLEURAL OR PERITONEAL FLUID

## 2018-05-20 LAB — MAGNESIUM: Magnesium: 2 mg/dL (ref 1.7–2.4)

## 2018-05-20 LAB — LACTIC ACID, PLASMA: Lactic Acid, Venous: 2.5 mmol/L (ref 0.5–1.9)

## 2018-05-20 LAB — MRSA PCR SCREENING: MRSA by PCR: NEGATIVE

## 2018-05-20 LAB — TROPONIN I: TROPONIN I: 0.03 ng/mL — AB (ref <0.03)

## 2018-05-20 LAB — PROCALCITONIN: Procalcitonin: 0.1 ng/mL

## 2018-05-20 MED ORDER — ZOLPIDEM TARTRATE 5 MG PO TABS
5.0000 mg | ORAL_TABLET | Freq: Once | ORAL | Status: AC
  Administered 2018-05-20: 5 mg via ORAL
  Filled 2018-05-20: qty 1

## 2018-05-20 MED ORDER — INSULIN ASPART 100 UNIT/ML ~~LOC~~ SOLN
0.0000 [IU] | Freq: Three times a day (TID) | SUBCUTANEOUS | Status: DC
  Administered 2018-05-20 (×2): 2 [IU] via SUBCUTANEOUS

## 2018-05-20 MED ORDER — ORAL CARE MOUTH RINSE
15.0000 mL | Freq: Two times a day (BID) | OROMUCOSAL | Status: DC
  Administered 2018-05-20 – 2018-05-24 (×7): 15 mL via OROMUCOSAL

## 2018-05-20 NOTE — Progress Notes (Signed)
Dr. Laural BenesJohnson notified of patient declining treatment and patient's request to speak to him. Orders to give patient dose of lasix, contact family and inform pt that Dr. Laural BenesJohnson will be by to see him this afternoon.

## 2018-05-20 NOTE — Progress Notes (Signed)
Pt expressed that his biggest concern with taking lasix is that he does not want to be urinating the bed. Pt states he is agreeable to taking lasix only if he has a foley catheter placed. States he had one when he was at [facility]. Dr. Laural BenesJohnson notified.

## 2018-05-20 NOTE — Progress Notes (Signed)
Pt hollering out, attempting to remove bipap. Nurse removed bipap and placed on 15L HFNC. Pt saturating between 85% to 88%. Explained that we would have to place Bipap back on. States he does not want it. States, "I will rip that mask to pieces if you try to put it back on." Advised pt on the possibility of his breathing worsening and possibility of him needing to be intubated if he is not oxygenating well. States he is okay with that.

## 2018-05-20 NOTE — Consult Note (Signed)
Consultation Note Date: 05/20/2018   Patient Name: Chad Richard  DOB: November 06, 1935  MRN: [id number]  Age / Sex: 82 y.o., male  [facility] Referring Physician: Cleora FleetJohnson, Clanford L, MD  Reason for Consultation: Establishing goals of care and Psychosocial/spiritual support  HPI/Patient Profile: 82 y.o. male  with past medical history of CHF, COPD with home oxygen use (he states 10 L), high blood pressure and cholesterol, diabetes, admitted on 05/19/2018 with acute on chronic hypoxemic respiratory failure secondary to pulmonary edema and CHF.   Clinical Assessment and Goals of Care: Chad Richard is lying quietly in bed.  He greets me making and keeping eye contact.  He is calm and cooperative.  There is no family at bedside at this time. Chad Richard tells me of a decline over the last 3 years, sharing that he has been "bedridden".  He shares that he has been wheelchair-bound for 8 years.  He shares that he used to be able to get outside and do a few things, work around the house doing chores, but is no longer able.  He shares that his 3 adult children now live in their home to help.  His wife cares for him as she can, but he tells me that she is in the beginning stages of dementia.  Chad Richard tells me that he feels his breathing is better after removing fluid.  We talked about Pleurx drain, hospice services.  I shared that I have read nursing staff notes that sedate that if he is going to die, he prefers to return to his own home and have time there.  I share that we understand his desire, and will support his wishes.   We also talk about the BiPAP, Chad Richard states, "we settled that this morning".  I share that palliative medicine focuses on what he does and does not want, what is important to him.  I ask,  "you would not want the BiPAP, even if it meant you were to die without it?"  Chad Richard  considers this and states that he would like to use the BiPAP instead of passing.  We talked about preferred place of death, he states home.  I share my concern that sometimes when people come into the hospital, they become so sick and are never able to leave.  Chad Richard states that he has 3 dogs at home that he would like to spend time with.  We talked in detail about hospice, in-home services at no cost what is and is not provided.  I encourage Mr. Green to hear from them what they can and cannot do.  He is agreeable.  He states that he lives in [place] and is agreeable to [facility].  I share that we will follow-up tomorrow.  We talked about healthcare power of attorney, see below. We talked about advanced directives in detail, see below.   Healthcare power of attorney NEXT OF KIN -Chad Richard states that his wife is in the early  stages of dementia.  His desire is that his adult children be his healthcare surrogates.  He tells me that he believes they know what he does and does not want.  Mr. Chilton Si tells me he has 3 children living at home now to help with him including daughter Chad Richard, and sons Chad Richard and toning.  He also has a daughter Chad Richard living in a [place] who is battling cancer, they have also lost a child.   SUMMARY OF RECOMMENDATIONS   At this point, continue full scope treatment. Agreeable to Pleurx drain if possible. Considering home with hospice for "treat the treatable" care. Considering CODE STATUS, states his son recommends no code.  Code Status/Advance Care Planning:  Full code -we discussed the concept of "treat the treatable".  We talked about the realities of CPR, chest compressions and intubation.  Symptom Management:   Per hospitalist, no additional needs at this time.  Palliative Prophylaxis:   Aspiration and Oral Care  Additional Recommendations (Limitations, Scope, Preferences):  Full Scope Treatment  Psycho-social/Spiritual:    Desire for further Chaplaincy support:yes  Additional Recommendations: Caregiving  Support/Resources and Education on Hospice  Prognosis:   < 6 months or less would not be surprising based on functional decline over the last few months, frailty, end-stage pulmonary disease.  Discharge Planning: To be determined, based on outcomes, patient and family desire.      Primary Diagnoses: Present on Admission: **None**   I have reviewed the medical record, interviewed the patient and family, and examined the patient. The following aspects are pertinent.  Past Medical History:  Diagnosis Date  . COPD (chronic obstructive pulmonary disease) (HCC)   . Diabetes mellitus without complication (HCC)   . Hypertension    Social History   Socioeconomic History  . Marital status: Married    Spouse name: Not on file  . Number of children: Not on file  . Years of education: Not on file  . Highest education level: Not on file  Occupational History  . Not on file  Social Needs  . Financial resource strain: Not on file  . Food insecurity:    Worry: Not on file    Inability: Not on file  . Transportation needs:    Medical: Not on file    Non-medical: Not on file  Tobacco Use  . Smoking status: Former Smoker    Types: Cigarettes  Substance and Sexual Activity  . Alcohol use: Never    Frequency: Never  . Drug use: Never  . Sexual activity: Not on file  Lifestyle  . Physical activity:    Days per week: Not on file    Minutes per session: Not on file  . Stress: Not on file  Relationships  . Social connections:    Talks on phone: Not on file    Gets together: Not on file    Attends religious service: Not on file    Active member of club or organization: Not on file    Attends meetings of clubs or organizations: Not on file    Relationship status: Not on file  Other Topics Concern  . Not on file  Social History Narrative  . Not on file   History reviewed. No pertinent family  history. Scheduled Meds: . aspirin EC  325 mg Oral QHS  . budesonide (PULMICORT) nebulizer solution  0.25 mg Nebulization BID  . furosemide  80 mg Intravenous Q12H  . gabapentin  300 mg Oral BID  . heparin  5,000 Units  Subcutaneous Q8H  . insulin aspart  0-15 Units Subcutaneous TID WC  . ipratropium-albuterol  3 mL Nebulization Q6H  . mouth rinse  15 mL Mouth Rinse BID  . pravastatin  20 mg Oral q morning - 10a  . sodium chloride flush  3 mL Intravenous Q12H   Continuous Infusions: . sodium chloride     PRN Meds:.sodium chloride, acetaminophen **OR** acetaminophen, ondansetron **OR** ondansetron (ZOFRAN) IV, sodium chloride flush Medications Prior to Admission:  Prior to Admission medications   Medication Sig Start Date End Date Taking? Authorizing Provider  albuterol (PROAIR HFA) 108 (90 Base) MCG/ACT inhaler Inhale 1-2 puffs into the lungs every 6 (six) hours as needed for wheezing or shortness of breath.   Yes [provider]  albuterol (PROVENTIL) (2.5 MG/3ML) 0.083% nebulizer solution Take 2.5 mg by nebulization every 4 (four) hours.   Yes [provider]  amLODipine (NORVASC) 10 MG tablet Take 10 mg by mouth every morning.   Yes [provider]  aspirin EC 325 MG tablet Take 325 mg by mouth at bedtime.   Yes [provider]  Cetirizine-Pseudoephedrine (ALLERGY RELIEF/NASAL DECONGEST PO) Take 2 sprays by mouth every evening.   Yes [provider]  diphenhydrAMINE (BENADRYL) 25 MG tablet Take 50 mg by mouth at bedtime as needed for sleep.   Yes [provider]  doxycycline (VIBRAMYCIN) 100 MG capsule Take 100 mg by mouth every 12 (twelve) hours. 10 day course starting on 05/14/18   Yes [provider]  fluticasone furoate-vilanterol (BREO ELLIPTA) 200-25 MCG/INH AEPB Inhale 1 puff into the lungs every evening.   Yes [provider]  furosemide (LASIX) 40 MG tablet Take 40 mg by mouth every morning.   Yes [provider]  gabapentin (NEURONTIN) 300 MG capsule Take 300 mg by mouth 2 (two) times daily.   Yes [provider]  Melatonin 10 MG CAPS Take 10 mg by mouth at bedtime as needed (for sleep).   Yes [provider]  metFORMIN (GLUCOPHAGE) 1000 MG tablet Take 1,000 mg by mouth 2 (two) times daily with a meal.   Yes [provider]  pravastatin (PRAVACHOL) 20 MG tablet Take 20 mg by mouth every morning.   Yes [provider]  predniSONE (DELTASONE) 20 MG tablet Take 20 mg by mouth See admin instructions. Starting on 05/14/2018 Take 3 tablets by mouth once, then take one tablet twice daily for 5 days, then stop   Yes [provider]  traMADol (ULTRAM) 50 MG tablet Take 100 mg by mouth every evening. *May take 100mg  every 8 hours as needed for pain   Yes [provider]   No Known Allergies Review of Systems  Unable to perform ROS: Age    Physical Exam  Constitutional: He is oriented to person, place, and time. He does not appear ill.  HENT:  Head: Atraumatic.  Cardiovascular: Normal rate.  Pulmonary/Chest: Effort normal. No respiratory distress.  Abdominal: Soft. He exhibits no distension. There is no guarding.  Musculoskeletal:       Right lower leg: He exhibits no edema.       Left lower leg: He exhibits no edema.  Neurological: He is alert and oriented to person, place, and time.  Skin: Skin is warm and dry.  Psychiatric: He has a normal mood and affect. His mood appears not anxious. He is not agitated.  Nursing note and vitals reviewed.   Vital Signs: BP 102/72   Pulse 86  Temp 98.1 F (36.7 C) (Oral)   Resp (!) 22   Ht 6\' 2"  (1.88 m)   Wt 96.9 kg (213 lb 10 oz)   SpO2 (!) 87%   BMI 27.43 kg/m  Pain Scale: 0-10   Pain Score: 0-No pain   SpO2: SpO2: (!) 87 % O2 Device:SpO2: (!) 87 % O2 Flow Rate: .O2 Flow Rate (L/min): 15 L/min  IO: Intake/output summary:   Intake/Output Summary (Last 24 hours) at 05/20/2018  1235 Last data filed at 05/20/2018 0951 Gross per 24 hour  Intake 423 ml  Output 2700 ml  Net -2277 ml    LBM:   Baseline Weight: Weight: 90.7 kg (200 lb) Most recent weight: Weight: 96.9 kg (213 lb 10 oz)     Palliative Assessment/Data:   Flowsheet Rows     Most Recent Value  Intake Tab  Referral Department  Hospitalist  Unit at Time of Referral  Intermediate Care Unit  Palliative Care Primary Diagnosis  Pulmonary  Date Notified  05/20/18  Palliative Care Type  New Palliative care  Reason for referral  Clarify Goals of Care  Date of Admission  05/19/18  Date first seen by Palliative Care  05/20/18  # of days Palliative referral response time  0 Day(s)  # of days IP prior to Palliative referral  1  Clinical Assessment  Palliative Performance Scale Score  40%  Pain Max last 24 hours  Not able to report  Pain Min Last 24 hours  Not able to report  Dyspnea Max Last 24 Hours  Not able to report  Dyspnea Min Last 24 hours  Not able to report  Psychosocial & Spiritual Assessment  Palliative Care Outcomes  Patient/Family meeting held?  Yes  Who was at the meeting?  Patient at bedside  Palliative Care Outcomes  Counseled regarding hospice, Provided advance care planning, Clarified goals of care      Time In: 0950 Time Out: 1040 Time Total: 50 minutes Greater than 50%  of this time was spent counseling and coordinating care related to the above assessment and plan.  Signed by: Katheran Awe, NP   Please contact Palliative Medicine Team phone at [phone] for questions and concerns.  For individual provider: See Loretha Stapler

## 2018-05-20 NOTE — Progress Notes (Signed)
Informed pt that Dr. Laural BenesJohnson will be by in to see him this afternoon. Educated on need for lasix to get fluid off of his lungs to allow for him to have an ease of breathing. Pt Still refusing lasix. States "I want to have a discussion about it." When asked what he wants to know/ discuss about, states "I want to talk to the doctor about it." pt does state he is agreeable to have thoracentesis performed. States the first 3 times he has had a thoracentesis it has seemed to help. Pt also states that he would rather not be hospitalized anymore. States, "I have been laying in bed for the past 3 weeks now and I figure if I am gonna die, I'd rather die in bed and home." Asked pt if he would be open to speaking to either our Chaplain or Palliative. Pt states yes he is.   Dr. Laural BenesJohnson notified

## 2018-05-20 NOTE — Progress Notes (Signed)
PROGRESS NOTE Chad Richard  ZOX:[id number]  DOB: Jan 01, 1936  DOA: 05/19/2018 PCP: [facility]  Brief Admission Hx: Chad Richard is a 82 y.o. male with medical history significant for COPD with prior tobacco use on home oxygen, questionable history of CHF, scoliosis and wheelchair-bound, diabetes, hypertension, and dyslipidemia who was brought to the ED by family members due to worsening shortness of breath and hypoxemia that appears to be ongoing since his prior hospitalization in [place] near the end of June.  At that time, reportedly he had bilateral thoracentesis with significant relief and apparently left AMA.  MDM/Assessment & Plan:   1. Acute on chronic hypoxemic respiratory failure - secondary to pulmonary edema and CHF.  Echo is pending.  He also has moderate right pleural effusion and is volume overloaded.  He has been refusing lasix.  He also may refuse the scheduled US thoracentesis.  He had been on bipap but has removed it and refusing to have it replaced.  He is not on 15L HFNC and only saturating in the mid 80s.  I have counseled him on his condition and the consequences of refusing appropriate treatment.  He verbalized understanding.   2. Acute systolic CHF exacerbation - The patient received lasix in ED but has been refusing subsequent doses.  I have counseled him about this and if he continues to refuse treatment in the hospital we may have to discharge him to the care of his family.  A 2 D echocardiogram has been ordered but unsure if patient will allow for the testing to be done.  I have once again asked the nurse to give him a dose of lasix this morning.  3. Elevated lactate - suspect secondary to hypoxemia, trending down, so signs of infection found.  4. Moderate right pleural effusion - Pt is ordered to have an US thoracentesis.  5. Elevated troponin -suspect secondary to demand ischemia and chronic hypoxemia-troponins have been flat.  Do not  suspect ACS. 6. Stage III CKD-slightly improved with diuresis.  Following. 7. Chronic COPD -no clinical signs of acute exacerbation, continue neb treatments.  DC steroids. 8. Hyperkalemia - resolved now.     DVT prophylaxis: heparin Code Status: Full  Family Communication: telephone Disposition Plan: pt requesting palliative medicine consult, will place request and follow up discussions  Consultants:  Palliative medicine  Subjective: PT IS REFUSING CARE IN THE HOSPITAL  Objective: Vitals:   05/20/18 0600 05/20/18 0700 05/20/18 0730 05/20/18 0800  BP: (!) 92/56 112/80  108/67  Pulse: 90 92 (!) 101 91  Resp: 18 (!) 23 (!) 23 17  Temp:    98.2 F (36.8 C)  TempSrc:    Axillary  SpO2: 92% 94% (!) 87% (!) 89%  Weight:      Height:        Intake/Output Summary (Last 24 hours) at 05/20/2018 0846 Last data filed at 05/20/2018 0730 Gross per 24 hour  Intake 420 ml  Output 2700 ml  Net -2280 ml   Filed Weights   05/19/18 1753 05/19/18 2115  Weight: 90.7 kg (200 lb) 96.9 kg (213 lb 10 oz)   REVIEW OF SYSTEMS  As per history otherwise all reviewed and reported negative  Exam:  General exam: awake, alert, NAD.  Respiratory system: rare exp rales heard, diffuse bibasilar crackles.  tachpnea seen, mild increased work of breathing. Cardiovascular system: normal S1 & S2 heard.  Gastrointestinal system: Abdomen is nondistended, soft and nontender. Normal bowel sounds heard. Central  nervous system: Alert and oriented. No focal neurological deficits. Extremities: 1+ edema bilateral lower extremities.   Data Reviewed: Basic Metabolic Panel: Recent Labs  Lab 05/19/18 1806 05/20/18 0355  NA 136 136  K 5.3* 4.8  CL 99 99  CO2 20* 24  GLUCOSE 142* 168*  BUN 56* 56*  CREATININE 1.76* 1.62*  CALCIUM 8.8* 8.7*  MG  --  2.0   Liver Function Tests: Recent Labs  Lab 05/19/18 1806  AST 24  ALT 18  ALKPHOS 55  BILITOT 0.9  PROT 6.5  ALBUMIN 3.8   No results for  input(s): LIPASE, AMYLASE in the last 168 hours. No results for input(s): AMMONIA in the last 168 hours. CBC: Recent Labs  Lab 05/19/18 1806 05/20/18 0355  WBC 14.3* 10.1  NEUTROABS 13.2*  --   HGB 12.4* 11.9*  HCT 43.4 40.7  MCV 68.8* 67.3*  PLT 252 225   Cardiac Enzymes: Recent Labs  Lab 05/19/18 1806 05/19/18 1951 05/20/18 0355  TROPONINI 0.03* 0.03* 0.03*   CBG (last 3)  Recent Labs    05/19/18 2349 05/20/18 0427 05/20/18 0745  GLUCAP 142* 145* 144*   Recent Results (from the past 240 hour(s))  Blood Culture (routine x 2)     Status: None (Preliminary result)   Collection Time: 05/19/18  7:48 PM  Result Value Ref Range Status   Specimen Description LEFT ANTECUBITAL  Final   Special Requests   Final    BOTTLES DRAWN AEROBIC AND ANAEROBIC Blood Culture adequate volume   Culture   Final    NO GROWTH < 12 HOURS Performed at [facility], [address] [place], [place] [address]    Report Status PENDING  Incomplete  Blood Culture (routine x 2)     Status: None (Preliminary result)   Collection Time: 05/19/18  7:50 PM  Result Value Ref Range Status   Specimen Description RIGHT ANTECUBITAL  Final   Special Requests   Final    BOTTLES DRAWN AEROBIC AND ANAEROBIC Blood Culture adequate volume   Culture   Final    NO GROWTH < 12 HOURS Performed at [facility], [address], [place], [place] [address]    Report Status PENDING  Incomplete  MRSA PCR Screening     Status: None   Collection Time: 05/19/18 10:35 PM  Result Value Ref Range Status   MRSA by PCR NEGATIVE NEGATIVE Final    Comment:        The GeneXpert MRSA Assay (FDA approved for NASAL specimens only), is one component of a comprehensive MRSA colonization surveillance program. It is not intended to diagnose MRSA infection nor to guide or monitor treatment for MRSA infections. Performed at [facility], [address], [place], [place] [address]      Studies: Dg Chest 2 View  Result Date:  05/19/2018 CLINICAL DATA:  82 year old male with a history of shortness of breath EXAM: CHEST - 2 VIEW COMPARISON:  None. FINDINGS: Cardiomediastinal silhouette borderline enlarged. Evidence of central vascular congestion. Interlobular septal thickening bilaterally. Blunting of the left costophrenic angle and right costophrenic angle. Meniscus on the lateral view within the costophrenic sulcus. No displaced fracture. IMPRESSION: Evidence of pulmonary edema and small bilateral pleural effusions. Electronically Signed   By: Gilmer Mor D.O.   On: 05/19/2018 19:30   Ct Chest Wo Contrast  Result Date: 05/19/2018 CLINICAL DATA:  82 y/o M; worsening shortness of breath and hypoxemia. Recent bilateral thoracentesis. History of COPD on home oxygen. EXAM: CT  CHEST WITHOUT CONTRAST TECHNIQUE: Multidetector CT imaging of the chest was performed following the standard protocol without IV contrast. COMPARISON:  05/19/2018 chest radiograph FINDINGS: Cardiovascular: Normal caliber thoracic aorta with mild calcific atherosclerosis. Mild coronary artery calcific atherosclerosis. Normal heart size. No pericardial effusion. Enlarged main pulmonary artery to 3.8 cm compatible pulmonary artery hypertension. Mediastinum/Nodes: Normal thyroid gland. Normal thoracic esophagus and trachea. AP window, lower paratracheal, and subcarinal lymphadenopathy. The largest right lower paratracheal lymph node measures 18 x 25 mm. Lungs/Pleura: Moderate right and small left pleural effusions with dependent atelectasis of the lower lobes. Mild centrilobular emphysema of the lungs. Smooth interlobular septal thickening compatible with interstitial edema. No consolidation. No pneumothorax. Upper Abdomen: Moderate volume of upper abdominal ascites, partially visualized. Musculoskeletal: No chest wall mass or suspicious bone lesions identified. Subcutaneous edema compatible with third spacing. IMPRESSION: 1. Interstitial pulmonary edema, mild  emphysema, moderate right pleural effusion, and small left pleural effusion. 2. Moderate volume of upper abdominal ascites and subcutaneous edema. 3. Mild coronary and aortic calcific atherosclerosis. 4. Enlarged pulmonary artery compatible with pulmonary artery hypertension. 5. AP window and lower paratracheal lymphadenopathy, probably related to pulmonary vascular congestion. Electronically Signed   By: Mitzi HansenLance  Furusawa-Stratton M.D.   On: 05/19/2018 22:48   Scheduled Meds: . aspirin EC  325 mg Oral QHS  . budesonide (PULMICORT) nebulizer solution  0.25 mg Nebulization BID  . furosemide      . furosemide  80 mg Intravenous Q12H  . gabapentin  300 mg Oral BID  . heparin  5,000 Units Subcutaneous Q8H  . insulin aspart  0-15 Units Subcutaneous Q4H  . ipratropium-albuterol  3 mL Nebulization Q6H  . methylPREDNISolone (SOLU-MEDROL) injection  40 mg Intravenous Q12H  . pneumococcal 23 valent vaccine  0.5 mL Intramuscular Tomorrow-1000  . pravastatin  20 mg Oral q morning - 10a  . sodium chloride flush  3 mL Intravenous Q12H  . traMADol  100 mg Oral QPM   Continuous Infusions: . sodium chloride    . ceFEPIme (MAXIPIME) 2 GM IVPB (Mini-Bag Plus)    . vancomycin      Principal Problem:   Acute hypoxemic respiratory failure (HCC) Active Problems:   CHF exacerbation (HCC)   Lactic acidosis   COPD (chronic obstructive pulmonary disease) (HCC)   Essential hypertension   Diabetes (HCC)   Dyslipidemia   AKI (acute kidney injury) (HCC)   Hyperkalemia   Elevated troponin  Critical Care Time spent: 33 minutes  Standley Dakinslanford Johnson, MD, FAAFP [facility] Pager [phone]  If 7PM-7AM, please contact night-coverage [email or web address] Password [email or web address] 05/20/2018, 8:46 AM    LOS: 1 day

## 2018-05-20 NOTE — Procedures (Signed)
PreOperative Dx: Recurrent RIGHT pleural effusion Postoperative Dx: REcurrent RIGHT pleural effusion Procedure:   US guided RIGHT thoracentesis Radiologist:  Tyron RussellBoles Anesthesia:  10 ml of 1% lidocaine Specimen:  1.2 L of clear yellow colored fluid EBL:   < 1 ml Complications: None

## 2018-05-20 NOTE — Progress Notes (Signed)
05/20/2018 9:19 AM  I went back a second time to speak with patient about refusing care in hospital.  He again refuses to take lasix. He agreed to thoracentesis.  He said that he was going to leave the hospital today.  I cautioned him on the dangers of leaving and he verbalized understanding.    Maryln Manuel. Aaliayah Miao MD

## 2018-05-20 NOTE — Progress Notes (Signed)
Patient refused insulin during the night. Stating he doesn't take it at home and doesn't need it. Patient also refused his lasix this morning. Encouraged him to take it because it will get rid of the extra fluid. Patient stated that his ankles were too skinny and was agitated because the dose given in the ED made him urinate all night. Patient still refused.

## 2018-05-20 NOTE — Progress Notes (Signed)
Unable to maintain an O2 saturation above 86%. Increased pt's FiO2 on BIPAP. 100% FiO2 required for O2 saturation in the 90's

## 2018-05-21 ENCOUNTER — Other Ambulatory Visit (HOSPITAL_COMMUNITY): Payer: Medicare Other

## 2018-05-21 ENCOUNTER — Inpatient Hospital Stay (HOSPITAL_COMMUNITY): Payer: Medicare Other

## 2018-05-21 DIAGNOSIS — E785 Hyperlipidemia, unspecified: Secondary | ICD-10-CM

## 2018-05-21 LAB — CBC WITH DIFFERENTIAL/PLATELET
Basophils Absolute: 0 10*3/uL (ref 0.0–0.1)
Basophils Relative: 0 %
Eosinophils Absolute: 0 10*3/uL (ref 0.0–0.7)
Eosinophils Relative: 0 %
HEMATOCRIT: 40.5 % (ref 39.0–52.0)
Hemoglobin: 11.8 g/dL — ABNORMAL LOW (ref 13.0–17.0)
LYMPHS ABS: 0.8 10*3/uL (ref 0.7–4.0)
LYMPHS PCT: 6 %
MCH: 19.6 pg — ABNORMAL LOW (ref 26.0–34.0)
MCHC: 29.1 g/dL — AB (ref 30.0–36.0)
MCV: 67.3 fL — AB (ref 78.0–100.0)
MONO ABS: 0.8 10*3/uL (ref 0.1–1.0)
MONOS PCT: 7 %
NEUTROS ABS: 10.8 10*3/uL — AB (ref 1.7–7.7)
Neutrophils Relative %: 87 %
Platelets: 199 10*3/uL (ref 150–400)
RBC: 6.02 MIL/uL — ABNORMAL HIGH (ref 4.22–5.81)
RDW: 20.8 % — AB (ref 11.5–15.5)
WBC: 12.4 10*3/uL — ABNORMAL HIGH (ref 4.0–10.5)

## 2018-05-21 LAB — COMPREHENSIVE METABOLIC PANEL
ALBUMIN: 3.3 g/dL — AB (ref 3.5–5.0)
ALK PHOS: 49 U/L (ref 38–126)
ALT: 17 U/L (ref 0–44)
ANION GAP: 10 (ref 5–15)
AST: 19 U/L (ref 15–41)
BUN: 55 mg/dL — ABNORMAL HIGH (ref 8–23)
CALCIUM: 8.4 mg/dL — AB (ref 8.9–10.3)
CO2: 31 mmol/L (ref 22–32)
Chloride: 97 mmol/L — ABNORMAL LOW (ref 98–111)
Creatinine, Ser: 1.48 mg/dL — ABNORMAL HIGH (ref 0.61–1.24)
GFR calc Af Amer: 50 mL/min — ABNORMAL LOW (ref >60)
GFR calc non Af Amer: 43 mL/min — ABNORMAL LOW (ref >60)
GLUCOSE: 99 mg/dL (ref 70–99)
POTASSIUM: 3.9 mmol/L (ref 3.5–5.1)
Sodium: 138 mmol/L (ref 135–145)
Total Bilirubin: 1.2 mg/dL (ref 0.3–1.2)
Total Protein: 5.7 g/dL — ABNORMAL LOW (ref 6.5–8.1)

## 2018-05-21 LAB — URINE CULTURE: Culture: NO GROWTH

## 2018-05-21 LAB — GLUCOSE, CAPILLARY
GLUCOSE-CAPILLARY: 361 mg/dL — AB (ref 70–99)
GLUCOSE-CAPILLARY: 115 mg/dL — AB (ref 70–99)
Glucose-Capillary: 119 mg/dL — ABNORMAL HIGH (ref 70–99)
Glucose-Capillary: 145 mg/dL — ABNORMAL HIGH (ref 70–99)

## 2018-05-21 LAB — MAGNESIUM: Magnesium: 1.9 mg/dL (ref 1.7–2.4)

## 2018-05-21 LAB — PH, BODY FLUID: pH, Body Fluid: 7.6

## 2018-05-21 LAB — PROCALCITONIN: Procalcitonin: 0.14 ng/mL

## 2018-05-21 MED ORDER — ZOLPIDEM TARTRATE 5 MG PO TABS
5.0000 mg | ORAL_TABLET | Freq: Every evening | ORAL | Status: AC | PRN
  Administered 2018-05-21 – 2018-05-23 (×3): 5 mg via ORAL
  Filled 2018-05-21 (×3): qty 1

## 2018-05-21 MED ORDER — INSULIN ASPART 100 UNIT/ML ~~LOC~~ SOLN: 0.0000 [IU] | Freq: Three times a day (TID) | SUBCUTANEOUS | Status: DC

## 2018-05-21 MED ORDER — INSULIN ASPART 100 UNIT/ML ~~LOC~~ SOLN
0.0000 [IU] | Freq: Three times a day (TID) | SUBCUTANEOUS | Status: DC
  Administered 2018-05-21: 15 [IU] via SUBCUTANEOUS
  Administered 2018-05-22 – 2018-05-23 (×3): 3 [IU] via SUBCUTANEOUS
  Administered 2018-05-23: 8 [IU] via SUBCUTANEOUS
  Administered 2018-05-24 (×2): 3 [IU] via SUBCUTANEOUS

## 2018-05-21 MED ORDER — POLYETHYLENE GLYCOL 3350 17 G PO PACK
17.0000 g | PACK | Freq: Every day | ORAL | Status: DC | PRN
  Administered 2018-05-22 – 2018-05-24 (×2): 17 g via ORAL
  Filled 2018-05-21 (×2): qty 1

## 2018-05-21 MED ORDER — FUROSEMIDE 10 MG/ML IJ SOLN
40.0000 mg | Freq: Every day | INTRAMUSCULAR | Status: DC
  Administered 2018-05-22: 40 mg via INTRAVENOUS
  Filled 2018-05-21: qty 4

## 2018-05-21 NOTE — Care Management Note (Signed)
Case Management Note  Patient Details  Name: Dorena DewJohn College MRN: [id number] Date of Birth: February 09, 1936  Subjective/Objective: Acute respiratory failure. From home with family. Wheelchair bound. Has home oxgyen. Refusing Bipap.  Requested palliative consult.  Considering hospice.                    Action/Plan: CM following for needs.   Expected Discharge Date:   05/23/2018               Expected Discharge Plan:     In-House Referral:  Hospice / Palliative Care  Discharge planning Services  CM Consult  Post Acute Care Choice:    Choice offered to:     DME Arranged:    DME Agency:     HH Arranged:    HH Agency:     Status of Service:  In process, will continue to follow  If discussed at Long Length of Stay Meetings, dates discussed:    Additional Comments:  Zyion Doxtater, Chrystine OilerSharley Diane, RN 05/21/2018, 1:38 PM

## 2018-05-21 NOTE — Progress Notes (Signed)
Received report from ICU nurse Gentry FitzElisabeth. Patient admitted to floor and is alert and oriented. Patient denies any pain or discomfort. Patient has multiple bruises on Bilateral upper extremity. Bruise on left arm patient indicates he received [facility]" There are two bruises to patients left lower legs that are scabbed patient unable to state how he got those bruises. Additionally patient has a bruised left great toe and right pinky toe. He states that he is wheelchair bound and "bumps into things a lot" Patient is resting comfortably in the bed and vital signs are as follows    05/21/18 1044  Vitals  Temp 97.8 F (36.6 C)  Temp Source Oral  BP 104/68  MAP (mmHg) 80  BP Location Right Arm  BP Method Automatic  Patient Position (if appropriate) Lying  Pulse Rate 79  Resp 20  Oxygen Therapy  SpO2 91 %  O2 Device Nasal Cannula  O2 Flow Rate (L/min) 15 L/min  Pain Assessment  Pain Scale 0-10  Pain Score 0

## 2018-05-21 NOTE — Progress Notes (Signed)
PROGRESS NOTE Chad Richard  ZOX:[id number]  DOB: 1936/01/06  DOA: 05/19/2018 PCP: [facility]  Brief Admission Hx: Chad Richard is a 82 y.o. male with medical history significant for COPD with prior tobacco use on home oxygen, questionable history of CHF, scoliosis and wheelchair-bound, diabetes, hypertension, and dyslipidemia who was brought to the ED by family members due to worsening shortness of breath and hypoxemia that appears to be ongoing since his prior hospitalization in [place] near the end of June.  At that time, reportedly he had bilateral thoracentesis with significant relief and apparently left AMA.  MDM/Assessment & Plan:   1. Acute on chronic hypoxemic respiratory failure - secondary to pulmonary edema and CHF.  Echo is pending.  He also has moderate right pleural effusion which was drained by thoracentesis with 1.2L removed.  He has been refusing lasix but agreed to take the lasix after catheter was placed.  He has refused bipap.   He is now on HFNC and saturations have been improving.  I have counseled him on his condition and the consequences of refusing appropriate treatment.  He verbalized understanding.   2. Acute systolic CHF exacerbation - The patient has a foley placed and has been allowing the RN to give him lasix.  He has diuresed 7 liters since admission.   He is feeling better.  I have counseled him about this and if he continues to refuse treatment in the hospital we may have to discharge him to the care of his family.  A 2 D echocardiogram has been ordered but unsure if patient will allow for the testing to be done.  He said that he had it done in [place] last month and we have requested records but have not received them. n  3. Elevated lactate - suspect secondary to hypoxemia, trending down, so signs of infection found.  4. Leukocytosis - this is likely reactive after thoracentesis done yesterday, monitor for s/s of infection.   Morning CXR pending.  5. Moderate right pleural effusion - s/p US thoracentesis 7/18 with 1.2L removed.  6. Elevated troponin -suspect secondary to demand ischemia and chronic hypoxemia-troponins have been flat.  Do not suspect ACS. 7. Stage III CKD-slightly improved with diuresis.  Following. 8. Chronic COPD -no clinical signs of acute exacerbation, continue neb treatments.  DC steroids. 9. Hyperkalemia - resolved now.     DVT prophylaxis: heparin Code Status: Full  Family Communication: telephone Disposition Plan: transfer to telemetry  Consultants:  Palliative medicine  Subjective: Pt says that he is feeling a little better after thoracentesis.   Objective: Vitals:   05/21/18 0627 05/21/18 0700 05/21/18 0758 05/21/18 0800  BP: 103/67 115/72 118/70 118/70  Pulse: 81 81 79 75  Resp: 20 15 (!) 9 16  Temp:   97.7 F (36.5 C)   TempSrc:   Oral   SpO2: 91% 94% 97% 93%  Weight:      Height:        Intake/Output Summary (Last 24 hours) at 05/21/2018 8119 Last data filed at 05/21/2018 0500 Gross per 24 hour  Intake 3 ml  Output 4800 ml  Net -4797 ml   Filed Weights   05/19/18 1753 05/19/18 2115 05/21/18 0500  Weight: 90.7 kg (200 lb) 96.9 kg (213 lb 10 oz) 90.4 kg (199 lb 4.7 oz)   REVIEW OF SYSTEMS  As per history otherwise all reviewed and reported negative  Exam:  General exam: awake, alert, NAD.  Respiratory system:  rare exp rales heard, crackles resolved. Cardiovascular system: normal S1 & S2 heard.  Gastrointestinal system: Abdomen is nondistended, soft and nontender. Normal bowel sounds heard. Central nervous system: Alert and oriented. No focal neurological deficits. Extremities: 1+ edema bilateral lower extremities.   Data Reviewed: Basic Metabolic Panel: Recent Labs  Lab 05/19/18 1806 05/20/18 0355 05/21/18 0405  NA 136 136 138  K 5.3* 4.8 3.9  CL 99 99 97*  CO2 20* 24 31  GLUCOSE 142* 168* 99  BUN 56* 56* 55*  CREATININE 1.76* 1.62* 1.48*    CALCIUM 8.8* 8.7* 8.4*  MG  --  2.0 1.9   Liver Function Tests: Recent Labs  Lab 05/19/18 1806 05/21/18 0405  AST 24 19  ALT 18 17  ALKPHOS 55 49  BILITOT 0.9 1.2  PROT 6.5 5.7*  ALBUMIN 3.8 3.3*   No results for input(s): LIPASE, AMYLASE in the last 168 hours. No results for input(s): AMMONIA in the last 168 hours. CBC: Recent Labs  Lab 05/19/18 1806 05/20/18 0355 05/21/18 0405  WBC 14.3* 10.1 12.4*  NEUTROABS 13.2*  --  10.8*  HGB 12.4* 11.9* 11.8*  HCT 43.4 40.7 40.5  MCV 68.8* 67.3* 67.3*  PLT 252 225 199   Cardiac Enzymes: Recent Labs  Lab 05/19/18 1806 05/19/18 1951 05/20/18 0355  TROPONINI 0.03* 0.03* 0.03*   CBG (last 3)  Recent Labs    05/20/18 1646 05/20/18 2128 05/21/18 0729  GLUCAP 147* 162* 119*   Recent Results (from the past 240 hour(s))  Blood Culture (routine x 2)     Status: None (Preliminary result)   Collection Time: 05/19/18  7:48 PM  Result Value Ref Range Status   Specimen Description LEFT ANTECUBITAL  Final   Special Requests   Final    BOTTLES DRAWN AEROBIC AND ANAEROBIC Blood Culture adequate volume   Culture   Final    NO GROWTH < 12 HOURS Performed at [facility], [address] [place], [place] [address]    Report Status PENDING  Incomplete  Blood Culture (routine x 2)     Status: None (Preliminary result)   Collection Time: 05/19/18  7:50 PM  Result Value Ref Range Status   Specimen Description RIGHT ANTECUBITAL  Final   Special Requests   Final    BOTTLES DRAWN AEROBIC AND ANAEROBIC Blood Culture adequate volume   Culture   Final    NO GROWTH < 12 HOURS Performed at [facility], [address] [place], [place] [address]    Report Status PENDING  Incomplete  MRSA PCR Screening     Status: None   Collection Time: 05/19/18 10:35 PM  Result Value Ref Range Status   MRSA by PCR NEGATIVE NEGATIVE Final    Comment:        The GeneXpert MRSA Assay (FDA approved for NASAL specimens only), is one component of  a comprehensive MRSA colonization surveillance program. It is not intended to diagnose MRSA infection nor to guide or monitor treatment for MRSA infections. Performed at [facility], [address] [place], [place] [address]   Gram stain     Status: None   Collection Time: 05/20/18  9:30 AM  Result Value Ref Range Status   Specimen Description PLEURAL  Final   Special Requests NONE  Final   Gram Stain   Final    CYTOSPIN SMEAR NO ORGANISMS SEEN WBC PRESENT, PREDOMINANTLY MONONUCLEAR Performed at [facility], [address] [place], [place] [address]    Report  Status 05/20/2018 FINAL  Final     Studies: Dg Chest 2 View  Result Date: 05/19/2018 CLINICAL DATA:  82 year old male with a history of shortness of breath EXAM: CHEST - 2 VIEW COMPARISON:  None. FINDINGS: Cardiomediastinal silhouette borderline enlarged. Evidence of central vascular congestion. Interlobular septal thickening bilaterally. Blunting of the left costophrenic angle and right costophrenic angle. Meniscus on the lateral view within the costophrenic sulcus. No displaced fracture. IMPRESSION: Evidence of pulmonary edema and small bilateral pleural effusions. Electronically Signed   By: Gilmer Mor D.O.   On: 05/19/2018 19:30   Ct Chest Wo Contrast  Result Date: 05/19/2018 CLINICAL DATA:  82 y/o M; worsening shortness of breath and hypoxemia. Recent bilateral thoracentesis. History of COPD on home oxygen. EXAM: CT CHEST WITHOUT CONTRAST TECHNIQUE: Multidetector CT imaging of the chest was performed following the standard protocol without IV contrast. COMPARISON:  05/19/2018 chest radiograph FINDINGS: Cardiovascular: Normal caliber thoracic aorta with mild calcific atherosclerosis. Mild coronary artery calcific atherosclerosis. Normal heart size. No pericardial effusion. Enlarged main pulmonary artery to 3.8 cm compatible pulmonary artery hypertension. Mediastinum/Nodes: Normal thyroid gland. Normal thoracic esophagus  and trachea. AP window, lower paratracheal, and subcarinal lymphadenopathy. The largest right lower paratracheal lymph node measures 18 x 25 mm. Lungs/Pleura: Moderate right and small left pleural effusions with dependent atelectasis of the lower lobes. Mild centrilobular emphysema of the lungs. Smooth interlobular septal thickening compatible with interstitial edema. No consolidation. No pneumothorax. Upper Abdomen: Moderate volume of upper abdominal ascites, partially visualized. Musculoskeletal: No chest wall mass or suspicious bone lesions identified. Subcutaneous edema compatible with third spacing. IMPRESSION: 1. Interstitial pulmonary edema, mild emphysema, moderate right pleural effusion, and small left pleural effusion. 2. Moderate volume of upper abdominal ascites and subcutaneous edema. 3. Mild coronary and aortic calcific atherosclerosis. 4. Enlarged pulmonary artery compatible with pulmonary artery hypertension. 5. AP window and lower paratracheal lymphadenopathy, probably related to pulmonary vascular congestion. Electronically Signed   By: Mitzi Hansen M.D.   On: 05/19/2018 22:48   Dg Chest Port 1 View  Result Date: 05/20/2018 CLINICAL DATA:  Post RIGHT thoracentesis EXAM: PORTABLE CHEST 1 VIEW COMPARISON:  Portable exam 0959 hours compared to 05/19/2018 FINDINGS: Enlargement of cardiac silhouette with vascular congestion. Minimal pulmonary edema. Decreased RIGHT pleural effusion and basilar atelectasis. No pneumothorax post thoracentesis. Bones demineralized. IMPRESSION: No pneumothorax following RIGHT thoracentesis. Electronically Signed   By: Ulyses Southward M.D.   On: 05/20/2018 10:17   US Thoracentesis Asp Pleural Space W/img Guide  Result Date: 05/20/2018 INDICATION: Recurrent RIGHT pleural effusion EXAM: ULTRASOUND GUIDED DIAGNOSTIC AND THERAPEUTIC RIGHT THORACENTESIS MEDICATIONS: None. COMPLICATIONS: None immediate. PROCEDURE: Procedure, benefits, and risks of procedure were  discussed with patient. Written informed consent for procedure was obtained. Time out protocol followed. Pleural effusion localized by ultrasound at the posterior RIGHT hemithorax. Skin prepped and draped in usual sterile fashion. Skin and soft tissues anesthetized with 10 mL of 1% lidocaine. 8 French thoracentesis catheter placed into the RIGHT pleural space. 1.2 L of clear yellow RIGHT pleural fluid aspirated by syringe pump. Procedure tolerated very well by patient without immediate complication. FINDINGS: A total of approximately 1.2 L of RIGHT pleural fluid was removed. Samples were sent to the laboratory as requested by the clinical team. IMPRESSION: Successful ultrasound guided RIGHT thoracentesis yielding 1.2 L of pleural fluid. Electronically Signed   By: Ulyses Southward M.D.   On: 05/20/2018 11:21   Scheduled Meds: . aspirin EC  325 mg Oral QHS  . budesonide (PULMICORT)  nebulizer solution  0.25 mg Nebulization BID  . furosemide  80 mg Intravenous Q12H  . gabapentin  300 mg Oral BID  . heparin  5,000 Units Subcutaneous Q8H  . insulin aspart  0-15 Units Subcutaneous TID WC  . ipratropium-albuterol  3 mL Nebulization Q6H  . mouth rinse  15 mL Mouth Rinse BID  . pravastatin  20 mg Oral q morning - 10a  . sodium chloride flush  3 mL Intravenous Q12H   Continuous Infusions: . sodium chloride      Principal Problem:   Acute hypoxemic respiratory failure (HCC) Active Problems:   CHF exacerbation (HCC)   Lactic acidosis   COPD (chronic obstructive pulmonary disease) (HCC)   Essential hypertension   Diabetes (HCC)   Dyslipidemia   AKI (acute kidney injury) (HCC)   Hyperkalemia   Elevated troponin   Goals of care, counseling/discussion   Palliative care by specialist   Encounter for hospice care discussion  Standley Dakins, MD, FAAFP [facility] Pager [phone]  If 7PM-7AM, please contact night-coverage [email or web address] Password [email or web address] 05/21/2018, 8:22 AM    LOS: 2 days

## 2018-05-21 NOTE — Progress Notes (Signed)
Attempted echo at 310 pm, patient eating and would rather continue eating than have echo.

## 2018-05-22 ENCOUNTER — Inpatient Hospital Stay (HOSPITAL_COMMUNITY): Payer: Medicare Other

## 2018-05-22 ENCOUNTER — Encounter (HOSPITAL_COMMUNITY): Payer: Self-pay

## 2018-05-22 LAB — CBC WITH DIFFERENTIAL/PLATELET
Basophils Absolute: 0 10*3/uL (ref 0.0–0.1)
Basophils Relative: 0 %
EOS ABS: 0 10*3/uL (ref 0.0–0.7)
EOS PCT: 0 %
HCT: 40.5 % (ref 39.0–52.0)
Hemoglobin: 11.8 g/dL — ABNORMAL LOW (ref 13.0–17.0)
LYMPHS ABS: 0.6 10*3/uL — AB (ref 0.7–4.0)
Lymphocytes Relative: 6 %
MCH: 19.7 pg — ABNORMAL LOW (ref 26.0–34.0)
MCHC: 29.1 g/dL — ABNORMAL LOW (ref 30.0–36.0)
MCV: 67.6 fL — ABNORMAL LOW (ref 78.0–100.0)
MONO ABS: 0.5 10*3/uL (ref 0.1–1.0)
Monocytes Relative: 5 %
NEUTROS PCT: 89 %
Neutro Abs: 9.2 10*3/uL — ABNORMAL HIGH (ref 1.7–7.7)
PLATELETS: 160 10*3/uL (ref 150–400)
RBC: 5.99 MIL/uL — AB (ref 4.22–5.81)
RDW: 21 % — AB (ref 11.5–15.5)
WBC: 10.3 10*3/uL (ref 4.0–10.5)

## 2018-05-22 LAB — COMPREHENSIVE METABOLIC PANEL
ALT: 24 U/L (ref 0–44)
AST: 22 U/L (ref 15–41)
Albumin: 3 g/dL — ABNORMAL LOW (ref 3.5–5.0)
Alkaline Phosphatase: 53 U/L (ref 38–126)
Anion gap: 9 (ref 5–15)
BILIRUBIN TOTAL: 1 mg/dL (ref 0.3–1.2)
BUN: 48 mg/dL — AB (ref 8–23)
CHLORIDE: 100 mmol/L (ref 98–111)
CO2: 29 mmol/L (ref 22–32)
CREATININE: 1.19 mg/dL (ref 0.61–1.24)
Calcium: 7.6 mg/dL — ABNORMAL LOW (ref 8.9–10.3)
GFR calc non Af Amer: 56 mL/min — ABNORMAL LOW (ref >60)
Glucose, Bld: 87 mg/dL (ref 70–99)
Potassium: 3.4 mmol/L — ABNORMAL LOW (ref 3.5–5.1)
Sodium: 138 mmol/L (ref 135–145)
Total Protein: 5.2 g/dL — ABNORMAL LOW (ref 6.5–8.1)

## 2018-05-22 LAB — GLUCOSE, CAPILLARY
GLUCOSE-CAPILLARY: 99 mg/dL (ref 70–99)
GLUCOSE-CAPILLARY: 180 mg/dL — AB (ref 70–99)
Glucose-Capillary: 166 mg/dL — ABNORMAL HIGH (ref 70–99)
Glucose-Capillary: 127 mg/dL — ABNORMAL HIGH (ref 70–99)

## 2018-05-22 MED ORDER — FUROSEMIDE 40 MG PO TABS
40.0000 mg | ORAL_TABLET | Freq: Every day | ORAL | Status: DC
  Administered 2018-05-23 – 2018-05-24 (×2): 40 mg via ORAL
  Filled 2018-05-22 (×2): qty 1

## 2018-05-22 NOTE — Care Management Note (Addendum)
Case Management Note  Patient Details  Name: Chad Richard MRN: [id number] Date of Birth: Mar 28, 1936  Subjective/Objective:               Spoke w patient's daughter. She states that the tank her dad has at home onoy goes up to 10L. He is currently at 12-15L. His oxygen is from [facility]. Discussed w Dr Laural BenesJohnson adding a RT referral to assess for an oximizer since patient is refusing BIPAP. Discussed with daughter their plans for discharge. She assured he was not going to leave AMA today, and that their plan is [facility]. Spoke w Dr Laural BenesJohnson about this and offered to start referral process, he agreed.  Referral called into [phone], awaiting to hear back  12:45 routed clinicals and demographics to [place] [facility] at [phone].      Action/Plan:   Expected Discharge Date:                  Expected Discharge Plan:  Home w Hospice Care  In-House Referral:  Hospice / Palliative Care  Discharge planning Services  CM Consult  Post Acute Care Choice:    Choice offered to:     DME Arranged:    DME Agency:     HH Arranged:    HH Agency:     Status of Service:  In process, will continue to follow  If discussed at Long Length of Stay Meetings, dates discussed:    Additional Comments:  Chad SabalDebbie Dakotah Orrego, RN 05/22/2018, 12:30 PM

## 2018-05-22 NOTE — Progress Notes (Signed)
PROGRESS NOTE Chad Richard  ZOX:[id number]  DOB: 1936-07-24  DOA: 05/19/2018 PCP: [facility]  Brief Admission Hx: Chad Richard is a 82 y.o. male with medical history significant for COPD with prior tobacco use on home oxygen, questionable history of CHF, scoliosis and wheelchair-bound, diabetes, hypertension, and dyslipidemia who was brought to the ED by family members due to worsening shortness of breath and hypoxemia that appears to be ongoing since his prior hospitalization in [place] near the end of June.  At that time, reportedly he had bilateral thoracentesis with significant relief and apparently left AMA.  MDM/Assessment & Plan:   1. Acute on chronic hypoxemic respiratory failure - secondary to pulmonary edema and CHF.  Pt refused his echocardiogram and we haven't received records from [facility] that were sent for days ago.  Clinically he is improving.  He reports that he is on 10L oxygen at home and will try to wean oxygen down to 10.  He also has moderate right pleural effusion which was drained by thoracentesis with 1.2L removed.  He had been refusing lasix but agreed to take the lasix after foley catheter was placed.  He has refused bipap.   I have counseled him on his condition and the consequences of refusing appropriate treatment.  He may decide to leave AMA later today.  He verbalized understanding of the risks and consequences.   2. Acute systolic CHF exacerbation - The patient has a foley placed and has been allowing the RN to give him lasix.  He has diuresed 10 liters since admission.   He is feeling better.  I have counseled him about this and if he continues to refuse treatment in the hospital we may have to discharge him to the care of his family.  A 2 D echocardiogram has been ordered but pt refused.  He said that he had it done in [place] last month and we have requested records but have not received them.   3. Elevated lactate - Resolved.   Suspect secondary to hypoxemia, trending down, so signs of infection found.  4. Leukocytosis - Resolved.  This was likely reactive after thoracentesis. CXR no signs of infection.   5. Moderate right pleural effusion - s/p US thoracentesis 7/18 with 1.2L removed.  6. Elevated troponin -suspect secondary to demand ischemia and chronic hypoxemia-troponins have been flat.  Do not suspect ACS. 7. Stage III CKD-slightly improved with diuresis.  Following. 8. Chronic COPD -no clinical signs of acute exacerbation, continue neb treatments.  DC steroids. 9. Hyperkalemia - resolved now.     DVT prophylaxis: heparin Code Status: Full  Family Communication: telephone Disposition Plan: transfer to telemetry  Consultants:  Palliative medicine  Subjective: Pt says that he wants to go home today.  He says that he is on 10L oxygen at home.  His [place] was off this morning when walked in the room.   Objective: Vitals:   05/21/18 2051 05/21/18 2057 05/22/18 0418 05/22/18 0548  BP:    115/65  Pulse:    87  Resp:    20  Temp:    98.4 F (36.9 C)  TempSrc:    Oral  SpO2: (!) 86% (!) 87% (!) 86% (!) 87%  Weight:    90.3 kg (199 lb 1.2 oz)  Height:        Intake/Output Summary (Last 24 hours) at 05/22/2018 1021 Last data filed at 05/22/2018 0900 Gross per 24 hour  Intake 960 ml  Output  2400 ml  Net -1440 ml   Filed Weights   05/19/18 2115 05/21/18 0500 05/22/18 0548  Weight: 96.9 kg (213 lb 10 oz) 90.4 kg (199 lb 4.7 oz) 90.3 kg (199 lb 1.2 oz)   REVIEW OF SYSTEMS  As per history otherwise all reviewed and reported negative  Exam:  General exam: awake, alert, NAD.  Respiratory system: rare exp rales heard, crackles resolved. Cardiovascular system: normal S1 & S2 heard.  Gastrointestinal system: Abdomen is nondistended, soft and nontender. Normal bowel sounds heard. Central nervous system: Alert and oriented. No focal neurological deficits. Extremities: 1+ edema bilateral lower extremities.     Data Reviewed: Basic Metabolic Panel: Recent Labs  Lab 05/19/18 1806 05/20/18 0355 05/21/18 0405 05/22/18 0716  NA 136 136 138 138  K 5.3* 4.8 3.9 3.4*  CL 99 99 97* 100  CO2 20* 24 31 29   GLUCOSE 142* 168* 99 87  BUN 56* 56* 55* 48*  CREATININE 1.76* 1.62* 1.48* 1.19  CALCIUM 8.8* 8.7* 8.4* 7.6*  MG  --  2.0 1.9  --    Liver Function Tests: Recent Labs  Lab 05/19/18 1806 05/21/18 0405 05/22/18 0716  AST 24 19 22   ALT 18 17 24   ALKPHOS 55 49 53  BILITOT 0.9 1.2 1.0  PROT 6.5 5.7* 5.2*  ALBUMIN 3.8 3.3* 3.0*   No results for input(s): LIPASE, AMYLASE in the last 168 hours. No results for input(s): AMMONIA in the last 168 hours. CBC: Recent Labs  Lab 05/19/18 1806 05/20/18 0355 05/21/18 0405 05/22/18 0716  WBC 14.3* 10.1 12.4* 10.3  NEUTROABS 13.2*  --  10.8* 9.2*  HGB 12.4* 11.9* 11.8* 11.8*  HCT 43.4 40.7 40.5 40.5  MCV 68.8* 67.3* 67.3* 67.6*  PLT 252 225 199 160   Cardiac Enzymes: Recent Labs  Lab 05/19/18 1806 05/19/18 1951 05/20/18 0355  TROPONINI 0.03* 0.03* 0.03*   CBG (last 3)  Recent Labs    05/21/18 1132 05/21/18 1722 05/21/18 2044  GLUCAP 145* 361* 115*   Recent Results (from the past 240 hour(s))  Urine culture     Status: None   Collection Time: 05/19/18  7:29 PM  Result Value Ref Range Status   Specimen Description   Final    URINE, RANDOM Performed at [facility], [address] [place], [place] [address]    Special Requests   Final    NONE Performed at [facility], [address] [place], [place] [address]    Culture   Final    NO GROWTH Performed at [facility], [address] [place], [place] [address]    Report Status 05/21/2018 FINAL  Final  Blood Culture (routine x 2)     Status: None (Preliminary result)   Collection Time: 05/19/18  7:48 PM  Result Value Ref Range Status   Specimen Description LEFT ANTECUBITAL  Final   Special Requests   Final    BOTTLES DRAWN AEROBIC AND ANAEROBIC Blood Culture  adequate volume   Culture   Final    NO GROWTH 3 DAYS Performed at [facility], [address] [place], [place] [address]    Report Status PENDING  Incomplete  Blood Culture (routine x 2)     Status: None (Preliminary result)   Collection Time: 05/19/18  7:50 PM  Result Value Ref Range Status   Specimen Description RIGHT ANTECUBITAL  Final   Special Requests   Final    BOTTLES DRAWN AEROBIC AND ANAEROBIC Blood Culture adequate volume  Culture   Final    NO GROWTH 3 DAYS Performed at [facility], [address] [place], [place] [address]    Report Status PENDING  Incomplete  MRSA PCR Screening     Status: None   Collection Time: 05/19/18 10:35 PM  Result Value Ref Range Status   MRSA by PCR NEGATIVE NEGATIVE Final    Comment:        The GeneXpert MRSA Assay (FDA approved for NASAL specimens only), is one component of a comprehensive MRSA colonization surveillance program. It is not intended to diagnose MRSA infection nor to guide or monitor treatment for MRSA infections. Performed at [facility], [address], [place], [place] [address]   Gram stain     Status: None   Collection Time: 05/20/18  9:30 AM  Result Value Ref Range Status   Specimen Description PLEURAL  Final   Special Requests NONE  Final   Gram Stain   Final    CYTOSPIN SMEAR NO ORGANISMS SEEN WBC PRESENT, PREDOMINANTLY MONONUCLEAR Performed at [facility], [address] [place], [place] [address]    Report Status 05/20/2018 FINAL  Final  Culture, body fluid-bottle     Status: None (Preliminary result)   Collection Time: 05/20/18  9:30 AM  Result Value Ref Range Status   Specimen Description PLEURAL  Final   Special Requests BOTTLES DRAWN AEROBIC AND ANAEROBIC 10 CC EACH  Final   Culture   Final    NO GROWTH 2 DAYS Performed at [facility], [address] [place], [place] [address]    Report Status PENDING  Incomplete     Studies: Dg Chest Port 1 View  Result Date: 05/21/2018 CLINICAL  DATA:  Pulmonary edema. EXAM: PORTABLE CHEST 1 VIEW COMPARISON:  05/20/2018; 05/19/2018; chest CT-05/19/2018; ultrasound-guided right-sided thoracentesis - 05/20/2018 FINDINGS: Grossly unchanged cardiac silhouette and mediastinal contours. The pulmonary vasculature remains indistinct with cephalization of flow. Grossly unchanged trace pleural effusions and associated bibasilar opacities, likely atelectasis. No new focal airspace opacities. No pneumothorax. No acute osseus abnormalities. IMPRESSION: Similar findings of cardiomegaly, mild pulmonary edema, trace pleural effusions and associated bibasilar opacities, likely atelectasis. Electronically Signed   By: Simonne Come M.D.   On: 05/21/2018 14:23   Scheduled Meds: . aspirin EC  325 mg Oral QHS  . budesonide (PULMICORT) nebulizer solution  0.25 mg Nebulization BID  . furosemide  40 mg Intravenous Daily  . gabapentin  300 mg Oral BID  . heparin  5,000 Units Subcutaneous Q8H  . insulin aspart  0-15 Units Subcutaneous TID WC  . ipratropium-albuterol  3 mL Nebulization Q6H  . mouth rinse  15 mL Mouth Rinse BID  . pravastatin  20 mg Oral q morning - 10a  . sodium chloride flush  3 mL Intravenous Q12H   Continuous Infusions: . sodium chloride      Principal Problem:   Acute hypoxemic respiratory failure (HCC) Active Problems:   CHF exacerbation (HCC)   Lactic acidosis   COPD (chronic obstructive pulmonary disease) (HCC)   Essential hypertension   Diabetes (HCC)   Dyslipidemia   AKI (acute kidney injury) (HCC)   Hyperkalemia   Elevated troponin   Goals of care, counseling/discussion   Palliative care by specialist   Encounter for hospice care discussion  Standley Dakins, MD, FAAFP [facility] Pager [phone]  If 7PM-7AM, please contact night-coverage [email or web address] Password [email or web address] 05/22/2018, 10:21 AM    LOS: 3 days

## 2018-05-22 NOTE — Progress Notes (Signed)
Patient states he wants to wait to see can get record of echo from hospital [facility]. States he signed release to let records from this hospital. Will notify doctor and nurse.

## 2018-05-22 NOTE — Progress Notes (Signed)
2200 medication on 05/22/18 give earlier per PT request. PT wants to sleep with little disturbance. PT given meds and nebulizing treatments by RT. No threats of leaving. No s/s of agitation or verbal abuse. PT pleasant and compliant at this time. Continue to monitor.

## 2018-05-22 NOTE — Progress Notes (Signed)
Patient is resting comfortably. 

## 2018-05-22 NOTE — Progress Notes (Signed)
Patient is upset and angry. Wanting to get his sleeping pill that is due at bedtime. He is yelling and verbally abusive. Charge nurse and primary Rn tried to explain to him why he cant get his sleeping pill much earlier. He stated not to wake him up for his evening medications if he gets his sleeping pill earlier. He stated he will leave if the pill is not given to him. Shortly after hospitalist went to patient's room and explained to him why he can't get his sleeping pill so early. Patient angry  With Md as well. He stated he received this pill the night before much earlier. He was informed that he did not receive it earlier, it was [facility] on [facility] the time it was given. Finally Dr. Rosaria FerriesAuthorized to give patient his bedtime medications before 9 pm.

## 2018-05-23 ENCOUNTER — Inpatient Hospital Stay (HOSPITAL_COMMUNITY): Payer: Medicare Other

## 2018-05-23 LAB — GLUCOSE, CAPILLARY
GLUCOSE-CAPILLARY: 167 mg/dL — AB (ref 70–99)
GLUCOSE-CAPILLARY: 114 mg/dL — AB (ref 70–99)
Glucose-Capillary: 256 mg/dL — ABNORMAL HIGH (ref 70–99)
Glucose-Capillary: 142 mg/dL — ABNORMAL HIGH (ref 70–99)

## 2018-05-23 MED ORDER — LISINOPRIL 5 MG PO TABS
5.0000 mg | ORAL_TABLET | Freq: Every day | ORAL | Status: DC
  Administered 2018-05-24: 5 mg via ORAL
  Filled 2018-05-23 (×2): qty 1

## 2018-05-23 NOTE — Progress Notes (Signed)
PROGRESS NOTE Chad Richard  JYN:[id number]  DOB: 1936/01/31  DOA: 05/19/2018 PCP: [facility]  Brief Admission Hx: Chad Richard is a 82 y.o. male with medical history significant for COPD with prior tobacco use on home oxygen, questionable history of CHF, scoliosis and wheelchair-bound, diabetes, hypertension, and dyslipidemia who was brought to the ED by family members due to worsening shortness of breath and hypoxemia that appears to be ongoing since his prior hospitalization in [place] near the end of June.  At that time, reportedly he had bilateral thoracentesis with significant relief and apparently left AMA.  MDM/Assessment & Plan:   1. Acute on chronic hypoxemic respiratory failure - secondary to pulmonary edema and CHF.  Pt refused his echocardiogram and we haven't received records from [facility] that were sent for days ago.  Clinically he is improving.  He reports that he is on 10L oxygen at home and will try to wean oxygen down to 10.  He also has moderate right pleural effusion which was drained by thoracentesis with 1.2L removed.  He had been refusing lasix but agreed to take the lasix after foley catheter was placed.  He has refused bipap.   I have counseled him on his condition and the consequences of refusing appropriate treatment.   DC foley.   2. Acute systolic CHF exacerbation - The patient has a foley placed and has been allowing the RN to give him lasix.  He has diuresed 10 liters since admission.   He is feeling better.  I have counseled him about this and if he continues to refuse treatment in the hospital we may have to discharge him to the care of his family.  A 2 D echocardiogram has been ordered but pt refused and the order was discontinued.  He said that he had it done in [place] last month and we have requested records but have not received them.   3. Elevated lactate - Resolved.  Suspect secondary to hypoxemia, trending down, so signs of  infection found.  4. Leukocytosis - Resolved.  This was likely reactive after thoracentesis. CXR no signs of infection.   5. Moderate right pleural effusion - s/p US thoracentesis 7/18 with 1.2L removed.  6. Elevated troponin -suspect secondary to demand ischemia and chronic hypoxemia-troponins have been flat.  Do not suspect ACS. 7. Stage III CKD-slightly improved with diuresis.  Following. 8. Chronic COPD -no clinical signs of acute exacerbation, continue neb treatments.  DC steroids. 9. Hyperkalemia - resolved now.     DVT prophylaxis: heparin Code Status: Full  Family Communication: telephone Disposition Plan: Home with hospice tomorrow and possible oximizer if it can be arranged.   Consultants:  Palliative medicine  Subjective: Pt says he feels better today. Much less SOB.  He slept well. Ambien is helping him a lot.  He is still refusing echo and it was cancelled.   Objective: Vitals:   05/23/18 0524 05/23/18 0534 05/23/18 0951 05/23/18 1003  BP: (!) 90/54  99/71   Pulse: 88     Resp: 20     Temp: 98.6 F (37 C)     TempSrc: Oral     SpO2: (!) 85% (!) 87%  90%  Weight:  94.4 kg (208 lb 1.8 oz)    Height:        Intake/Output Summary (Last 24 hours) at 05/23/2018 1201 Last data filed at 05/23/2018 1102 Gross per 24 hour  Intake 1920 ml  Output 2400 ml  Net -480 ml   Filed Weights   05/21/18 0500 05/22/18 0548 05/23/18 0534  Weight: 90.4 kg (199 lb 4.7 oz) 90.3 kg (199 lb 1.2 oz) 94.4 kg (208 lb 1.8 oz)   REVIEW OF SYSTEMS  As per history otherwise all reviewed and reported negative  Exam:  General exam: awake, alert, NAD.  Respiratory system: rare exp rales heard, crackles resolved. Cardiovascular system: normal S1 & S2 heard.  Gastrointestinal system: Abdomen is nondistended, soft and nontender. Normal bowel sounds heard. Central nervous system: Alert and oriented. No focal neurological deficits. Extremities: 1+ edema bilateral lower extremities.   Data  Reviewed: Basic Metabolic Panel: Recent Labs  Lab 05/19/18 1806 05/20/18 0355 05/21/18 0405 05/22/18 0716  NA 136 136 138 138  K 5.3* 4.8 3.9 3.4*  CL 99 99 97* 100  CO2 20* 24 31 29   GLUCOSE 142* 168* 99 87  BUN 56* 56* 55* 48*  CREATININE 1.76* 1.62* 1.48* 1.19  CALCIUM 8.8* 8.7* 8.4* 7.6*  MG  --  2.0 1.9  --    Liver Function Tests: Recent Labs  Lab 05/19/18 1806 05/21/18 0405 05/22/18 0716  AST 24 19 22   ALT 18 17 24   ALKPHOS 55 49 53  BILITOT 0.9 1.2 1.0  PROT 6.5 5.7* 5.2*  ALBUMIN 3.8 3.3* 3.0*   No results for input(s): LIPASE, AMYLASE in the last 168 hours. No results for input(s): AMMONIA in the last 168 hours. CBC: Recent Labs  Lab 05/19/18 1806 05/20/18 0355 05/21/18 0405 05/22/18 0716  WBC 14.3* 10.1 12.4* 10.3  NEUTROABS 13.2*  --  10.8* 9.2*  HGB 12.4* 11.9* 11.8* 11.8*  HCT 43.4 40.7 40.5 40.5  MCV 68.8* 67.3* 67.3* 67.6*  PLT 252 225 199 160   Cardiac Enzymes: Recent Labs  Lab 05/19/18 1806 05/19/18 1951 05/20/18 0355  TROPONINI 0.03* 0.03* 0.03*   CBG (last 3)  Recent Labs    05/22/18 2017 05/23/18 0750 05/23/18 1126  GLUCAP 127* 167* 114*   Recent Results (from the past 240 hour(s))  Urine culture     Status: None   Collection Time: 05/19/18  7:29 PM  Result Value Ref Range Status   Specimen Description   Final    URINE, RANDOM Performed at [facility], [address], [place], [place] [address]    Special Requests   Final    NONE Performed at [facility], [address], [place], [place] [address]    Culture   Final    NO GROWTH Performed at [facility], [address], [place], [place] [address]    Report Status 05/21/2018 FINAL  Final  Blood Culture (routine x 2)     Status: None (Preliminary result)   Collection Time: 05/19/18  7:48 PM  Result Value Ref Range Status   Specimen Description LEFT ANTECUBITAL  Final   Special Requests   Final    BOTTLES DRAWN AEROBIC AND ANAEROBIC Blood Culture adequate  volume   Culture   Final    NO GROWTH 4 DAYS Performed at [facility], [address], [place], [place] [address]    Report Status PENDING  Incomplete  Blood Culture (routine x 2)     Status: None (Preliminary result)   Collection Time: 05/19/18  7:50 PM  Result Value Ref Range Status   Specimen Description RIGHT ANTECUBITAL  Final   Special Requests   Final    BOTTLES DRAWN AEROBIC AND ANAEROBIC Blood Culture adequate volume   Culture  Final    NO GROWTH 4 DAYS Performed at [facility], [address] [place], [place] [address]    Report Status PENDING  Incomplete  MRSA PCR Screening     Status: None   Collection Time: 05/19/18 10:35 PM  Result Value Ref Range Status   MRSA by PCR NEGATIVE NEGATIVE Final    Comment:        The GeneXpert MRSA Assay (FDA approved for NASAL specimens only), is one component of a comprehensive MRSA colonization surveillance program. It is not intended to diagnose MRSA infection nor to guide or monitor treatment for MRSA infections. Performed at [facility], [address] [place], [place] [address]   Gram stain     Status: None   Collection Time: 05/20/18  9:30 AM  Result Value Ref Range Status   Specimen Description PLEURAL  Final   Special Requests NONE  Final   Gram Stain   Final    CYTOSPIN SMEAR NO ORGANISMS SEEN WBC PRESENT, PREDOMINANTLY MONONUCLEAR Performed at [facility], [address] [place], [place] [address]    Report Status 05/20/2018 FINAL  Final  Culture, body fluid-bottle     Status: None (Preliminary result)   Collection Time: 05/20/18  9:30 AM  Result Value Ref Range Status   Specimen Description PLEURAL  Final   Special Requests BOTTLES DRAWN AEROBIC AND ANAEROBIC 10 CC EACH  Final   Culture   Final    NO GROWTH 3 DAYS Performed at [facility], [address] [place], [place] [address]    Report Status PENDING  Incomplete     Studies: Dg Chest Port 1 View  Result Date: 05/21/2018 CLINICAL DATA:   Pulmonary edema. EXAM: PORTABLE CHEST 1 VIEW COMPARISON:  05/20/2018; 05/19/2018; chest CT-05/19/2018; ultrasound-guided right-sided thoracentesis - 05/20/2018 FINDINGS: Grossly unchanged cardiac silhouette and mediastinal contours. The pulmonary vasculature remains indistinct with cephalization of flow. Grossly unchanged trace pleural effusions and associated bibasilar opacities, likely atelectasis. No new focal airspace opacities. No pneumothorax. No acute osseus abnormalities. IMPRESSION: Similar findings of cardiomegaly, mild pulmonary edema, trace pleural effusions and associated bibasilar opacities, likely atelectasis. Electronically Signed   By: Simonne ComeJohn  Watts M.D.   On: 05/21/2018 14:23   Scheduled Meds: . aspirin EC  325 mg Oral QHS  . budesonide (PULMICORT) nebulizer solution  0.25 mg Nebulization BID  . furosemide  40 mg Oral Daily  . gabapentin  300 mg Oral BID  . heparin  5,000 Units Subcutaneous Q8H  . insulin aspart  0-15 Units Subcutaneous TID WC  . ipratropium-albuterol  3 mL Nebulization Q6H  . lisinopril  5 mg Oral Daily  . mouth rinse  15 mL Mouth Rinse BID  . pravastatin  20 mg Oral q morning - 10a   Continuous Infusions:   Principal Problem:   Acute hypoxemic respiratory failure (HCC) Active Problems:   CHF exacerbation (HCC)   Lactic acidosis   COPD (chronic obstructive pulmonary disease) (HCC)   Essential hypertension   Diabetes (HCC)   Dyslipidemia   AKI (acute kidney injury) (HCC)   Hyperkalemia   Elevated troponin   Goals of care, counseling/discussion   Palliative care by specialist   Encounter for hospice care discussion  Standley Dakinslanford Johnson, MD, FAAFP [facility] Pager [phone]  If 7PM-7AM, please contact night-coverage [email or web address] Password [email or web address] 05/23/2018, 12:01 PM    LOS: 4 days

## 2018-05-23 NOTE — Progress Notes (Signed)
Pt given ambien po at 1845 per his request.

## 2018-05-23 NOTE — Progress Notes (Signed)
Per Dr.Johnson, cancel echo.

## 2018-05-23 NOTE — Progress Notes (Signed)
O2 sats remain between 84 and 87%. PT states O2 sats are never above 89%. O2 on 11L. Continue to monitor

## 2018-05-24 ENCOUNTER — Encounter (HOSPITAL_COMMUNITY): Payer: Self-pay

## 2018-05-24 DIAGNOSIS — N179 Acute kidney failure, unspecified: Secondary | ICD-10-CM

## 2018-05-24 DIAGNOSIS — J90 Pleural effusion, not elsewhere classified: Secondary | ICD-10-CM

## 2018-05-24 DIAGNOSIS — R748 Abnormal levels of other serum enzymes: Secondary | ICD-10-CM

## 2018-05-24 DIAGNOSIS — I2781 Cor pulmonale (chronic): Secondary | ICD-10-CM

## 2018-05-24 DIAGNOSIS — J810 Acute pulmonary edema: Secondary | ICD-10-CM

## 2018-05-24 DIAGNOSIS — Z9889 Other specified postprocedural states: Secondary | ICD-10-CM

## 2018-05-24 DIAGNOSIS — I5033 Acute on chronic diastolic (congestive) heart failure: Secondary | ICD-10-CM

## 2018-05-24 DIAGNOSIS — J9622 Acute and chronic respiratory failure with hypercapnia: Secondary | ICD-10-CM

## 2018-05-24 DIAGNOSIS — Z7189 Other specified counseling: Secondary | ICD-10-CM

## 2018-05-24 DIAGNOSIS — E875 Hyperkalemia: Secondary | ICD-10-CM

## 2018-05-24 DIAGNOSIS — J9621 Acute and chronic respiratory failure with hypoxia: Secondary | ICD-10-CM | POA: Diagnosis present

## 2018-05-24 LAB — CULTURE, BLOOD (ROUTINE X 2)
CULTURE: NO GROWTH
Culture: NO GROWTH
SPECIAL REQUESTS: ADEQUATE
Special Requests: ADEQUATE

## 2018-05-24 LAB — GLUCOSE, CAPILLARY
GLUCOSE-CAPILLARY: 156 mg/dL — AB (ref 70–99)
Glucose-Capillary: 188 mg/dL — ABNORMAL HIGH (ref 70–99)

## 2018-05-24 LAB — PATHOLOGIST SMEAR REVIEW

## 2018-05-24 MED ORDER — MINERAL OIL RE ENEM
1.0000 | ENEMA | Freq: Once | RECTAL | Status: AC
  Administered 2018-05-24: 1 via RECTAL

## 2018-05-24 MED ORDER — ZOLPIDEM TARTRATE 5 MG PO TABS: 5.0000 mg | ORAL_TABLET | Freq: Every evening | ORAL | 0 refills | Status: AC | PRN

## 2018-05-24 MED ORDER — SENNOSIDES-DOCUSATE SODIUM 8.6-50 MG PO TABS
2.0000 | ORAL_TABLET | Freq: Two times a day (BID) | ORAL | Status: DC
  Administered 2018-05-24: 2 via ORAL
  Filled 2018-05-24: qty 2

## 2018-05-24 MED ORDER — BISACODYL 10 MG RE SUPP: 10.0000 mg | Freq: Every day | RECTAL | Status: DC | PRN

## 2018-05-24 MED ORDER — IPRATROPIUM-ALBUTEROL 0.5-2.5 (3) MG/3ML IN SOLN: 3.0000 mL | Freq: Four times a day (QID) | RESPIRATORY_TRACT | 0 refills | Status: AC | PRN

## 2018-05-24 NOTE — Care Management Important Message (Signed)
Important Message  Patient Details  Name: Chad DewJohn Richard MRN: [id number] Date of Birth: 09-25-1936   Medicare Important Message Given:  Yes    Renie OraHawkins, Adri Schloss Smith 05/24/2018, 11:50 AM

## 2018-05-24 NOTE — Progress Notes (Signed)
Chad Richard discharged Home per MD order.  Discharge instructions reviewed and discussed with the patient, all questions and concerns answered. Copy of instructions and scripts given to patient.  Allergies as of 05/24/2018   No Known Allergies     Medication List    STOP taking these medications   amLODipine 10 MG tablet Commonly known as:  NORVASC   doxycycline 100 MG capsule Commonly known as:  VIBRAMYCIN   predniSONE 20 MG tablet Commonly known as:  DELTASONE     TAKE these medications   ALLERGY RELIEF/NASAL DECONGEST PO Take 2 sprays by mouth every evening.   aspirin EC 325 MG tablet Take 325 mg by mouth at bedtime.   BREO ELLIPTA 200-25 MCG/INH Aepb Generic drug:  fluticasone furoate-vilanterol Inhale 1 puff into the lungs every evening.   diphenhydrAMINE 25 MG tablet Commonly known as:  BENADRYL Take 50 mg by mouth at bedtime as needed for sleep.   furosemide 40 MG tablet Commonly known as:  LASIX Take 40 mg by mouth every morning.   gabapentin 300 MG capsule Commonly known as:  NEURONTIN Take 300 mg by mouth 2 (two) times daily.   ipratropium-albuterol 0.5-2.5 (3) MG/3ML Soln Commonly known as:  DUONEB Take 3 mLs by nebulization every 6 (six) hours as needed.   Melatonin 10 MG Caps Take 10 mg by mouth at bedtime as needed (for sleep).   metFORMIN 1000 MG tablet Commonly known as:  GLUCOPHAGE Take 1,000 mg by mouth 2 (two) times daily with a meal.   pravastatin 20 MG tablet Commonly known as:  PRAVACHOL Take 20 mg by mouth every morning.   PROAIR HFA 108 (90 Base) MCG/ACT inhaler Generic drug:  albuterol Inhale 1-2 puffs into the lungs every 6 (six) hours as needed for wheezing or shortness of breath. What changed:  Another medication with the same name was removed. Continue taking this medication, and follow the directions you see here.   traMADol 50 MG tablet Commonly known as:  ULTRAM Take 100 mg by mouth every evening. *May take 100mg  every 8  hours as needed for pain   zolpidem 5 MG tablet Commonly known as:  AMBIEN Take 1 tablet (5 mg total) by mouth at bedtime as needed for sleep.       Patients skin is clean, dry and intact, no evidence of skin break down. IV site discontinued and catheter remains intact. Site without signs and symptoms of complications. Dressing and pressure applied.  Patient escorted to car by NT in a wheelchair,  no distress noted upon discharge.  Chad Richard 05/24/2018 4:14 PM

## 2018-05-24 NOTE — Progress Notes (Signed)
Patient appears to be having more trouble tonight with his breathing. He is restless seems to be working more, breath sounds decreased, saturation 88 on 10 lpm , WOB increased.

## 2018-05-24 NOTE — Care Management Note (Signed)
Case Management Note  Patient Details  Name: Chad Richard MRN: [id number] Date of Birth: [birth year]  Expected Discharge Date:     05/24/18             Expected Discharge Plan:  Home w Hospice Care  In-House Referral:  Hospice / Palliative Care  Discharge planning Services  CM Consult  Status of Service:  Completed, signed off  If discussed at Long Length of Stay Meetings, dates discussed:    Additional Comments: DC home today with referral to [facility] [place]. CM [facility], pt discharging home today via care, daughter Marylu Lund(Janet) will transport, she is aware Hospice should be contacting her. Pt is on pta liter flow oxygen. DC summary will be routed to Hospice when available.   Malcolm Metrohildress, Kevin Mario Demske, RN 05/24/2018, 2:22 PM

## 2018-05-24 NOTE — Progress Notes (Signed)
Palliative:  [name] is resting quietly In bed in a dark room.  His son is at bedside.  We talked about Chad Richard's current acute and chronic health concerns in detail including his chronic and progressive lung disease, his congestive heart failure, his physical debility.  We discussed the benefits of in-home hospice for treat the treatable, in detail.  [name]'s son asks about physical therapy, we talked [name]'s autonomy, ask if he would like physical therapy, he clearly states he would not.  [name] states that he feels hospice care, treat the treatable, symptom management would be best for him.  He is agreeable to return home with the benefits of [facility] of [place] treat the treatable hospice.  Son is in agreement. We talked about bowel regimen as [name] shares that he has not had a BM in 5 days. Talk about CODE STATUS, but [name] and his son continue to divert the conversation toward other matters.  They are unable to make any statements or even ask questions at this point related to CODE STATUS. Conference with nursing staff related to bowel regimen, need for enema, in-home hospice services requested. Conference with case manager related to desire for hospice service with [facility] for treat the treatable hospice. Conference with hospitalist related to plan of care/disposition. 50 minutes, extended time Chad Carmelasha Dove, NP Palliative Medicine Team Team Phone # [phone]  Greater than 50% of this time was spent counseling and coordinating care related to the above assessment and plan

## 2018-05-24 NOTE — Discharge Summary (Signed)
Physician Discharge Summary  Chad Richard MWU:[id number] DOB: 01-16-1936 DOA: 05/19/2018  [facility]  Admit date: 05/19/2018 Discharge date: 05/24/2018  Admitted From: home Disposition:  Home with hospice  Recommendations for Outpatient Follow-up:  Follow up with [facility] [place] [facility] [place]   Home Health: none Equipment/Devices: high flow [place] @10  L/ min  Discharge Condition: Guarded CODE STATUS: DNR Diet recommendation: Heart Healthy / Carb Modified    Discharge Diagnoses:  Principal Problem:   Acute on chronic respiratory failure with hypoxia and hypercapnia (HCC)   Active Problems:   Acute on chronic diastolic CHF (congestive heart failure) (HCC)   Lactic acidosis   COPD (chronic obstructive pulmonary disease) (HCC)   Essential hypertension   Diabetes (HCC)   Dyslipidemia   AKI (acute kidney injury) (HCC)   Hyperkalemia   Elevated troponin   Goals of care, counseling/discussion   Palliative care by specialist   Encounter for hospice care discussion   DNR (do not resuscitate) discussion   Cor pulmonale (chronic) (HCC)  Brief narrative/HPI Please refer to admission H&P for details, in brief, 747 82-year-old male with COPD on home O2 (previously 4 L at rest and 8 L on exertion), cor pulmonale, diastolic CHF with mild to moderate pulmonary hypertension as per 2D echo and right heart cath done at [facility] in [place] 5 weeks back, diabetes mellitus on metformin, hypertension and dyslipidemia who was brought to the ED by his family with increasing shortness of breath and hypoxia.  As per family symptoms have been progressive since he left the hospital in June AMA.  During that time he also had bilateral thoracentesis with significant relief. Patient was found to be in acute on chronic hypoxemic respiratory failure with pulmonary edema on exam. Admitted for further management.  Hospital course Acute on chronic respiratory failure with  hypoxia and hypercapnia Suspect secondary acute on chronic diastolic CHF .  Patient refused echo stating he had it done recently at outside hospital.  We were finally able to obtain records from [facility] in [place] which showed the findings above.  He also had a Swan-Ganz catheter placed and during that time. Patient reported being on 10 L oxygen at home recently and trying to wean down.  He had moderate right-sided pleural effusion on presentation that was drained yielding 1.2 L fluid. Patient was refusing several treatments including echo and Lasix which he finally agreed to take. He also refused BiPAP. Patient was placed on Foley and diuresed with IV Lasix and a negative balance of >10 L since admission. Patient clinically feels much better with diuresis and thoracentesis.  He still requiring 10 L nasal cannula with sats in the high 80s. Patient refused 2D echo.   I will discharge him on his home dose of Lasix 40 mg daily.  Will hold off amlodipine given his low blood pressure.  Goals of care Palliative care discussion with patient and family regarding goals of care.  He wanted full scope of treatment.  I spoke with him in presence of his wife, son and daughter and discuss his overall health and prognosis.  He wishes not to be placed on a ventilator, undergo aggressive resuscitation including CPR and defibrillator but wants to be treated for the treatable and prefers to go home with home hospice. Referral has been sent to hospice of  [place].  Patient and family would like to come back to the hospital if his symptoms worsen and hospice unable  to manage him at home.  During the hospital stay patient has been difficult refusing tests and treatments and threatening to leave AMA.  Extensive discussion were done with patient and family about his guarded health and overall prognosis.   Elevated lactic acid. Suspect secondary to hypoxemia, no signs of infection.  And  resolved.  Leukocytosis Suspect reactive.  No signs of infection.  Moderate right pleural effusion Underwent thoracentesis with 1.2 L fluid removed.  Elevated troponin Suspect demand ischemia.  Acute on chronic stage III kidney disease. Suspect cardiorenal syndrome.  Improved with diuresis.  COPD with chronic hypoxic and hypercapnic respiratory failure. Received steroid briefly but discontinued.  Continue home inhaler.  Was on albuterol nebulizer at home and switched to DuoNeb. Continue high flow nasal cannula.  Hyperkalemia Resolved  Procedure: Ultrasound paracentesis, CT chest without contrast  Consult: Palliative care Disposition: Home  Family communication: Wife, son and daughter at bedside  Discharge Instructions   Allergies as of 05/24/2018   No Known Allergies     Medication List    STOP taking these medications   amLODipine 10 MG tablet Commonly known as:  NORVASC   doxycycline 100 MG capsule Commonly known as:  VIBRAMYCIN   predniSONE 20 MG tablet Commonly known as:  DELTASONE     TAKE these medications   ALLERGY RELIEF/NASAL DECONGEST PO Take 2 sprays by mouth every evening.   aspirin EC 325 MG tablet Take 325 mg by mouth at bedtime.   BREO ELLIPTA 200-25 MCG/INH Aepb Generic drug:  fluticasone furoate-vilanterol Inhale 1 puff into the lungs every evening.   diphenhydrAMINE 25 MG tablet Commonly known as:  BENADRYL Take 50 mg by mouth at bedtime as needed for sleep.   furosemide 40 MG tablet Commonly known as:  LASIX Take 40 mg by mouth every morning.   gabapentin 300 MG capsule Commonly known as:  NEURONTIN Take 300 mg by mouth 2 (two) times daily.   ipratropium-albuterol 0.5-2.5 (3) MG/3ML Soln Commonly known as:  DUONEB Take 3 mLs by nebulization every 6 (six) hours as needed.   Melatonin 10 MG Caps Take 10 mg by mouth at bedtime as needed (for sleep).   metFORMIN 1000 MG tablet Commonly known as:  GLUCOPHAGE Take 1,000 mg  by mouth 2 (two) times daily with a meal.   pravastatin 20 MG tablet Commonly known as:  PRAVACHOL Take 20 mg by mouth every morning.   PROAIR HFA 108 (90 Base) MCG/ACT inhaler Generic drug:  albuterol Inhale 1-2 puffs into the lungs every 6 (six) hours as needed for wheezing or shortness of breath. What changed:  Another medication with the same name was removed. Continue taking this medication, and follow the directions you see here.   traMADol 50 MG tablet Commonly known as:  ULTRAM Take 100 mg by mouth every evening. *May take 100mg  every 8 hours as needed for pain   zolpidem 5 MG tablet Commonly known as:  AMBIEN Take 1 tablet (5 mg total) by mouth at bedtime as needed for sleep.      Follow-up Information    [facility] Schedule an appointment as soon as possible for a visit in 1 week(s).   Contact information: [address] [place] [address] [phone]        [facility] [place] Follow up.          No Known Allergies    Procedures/Studies: Dg Chest 2 View  Result Date: 05/19/2018 CLINICAL DATA:  82 year old  male with a history of shortness of breath EXAM: CHEST - 2 VIEW COMPARISON:  None. FINDINGS: Cardiomediastinal silhouette borderline enlarged. Evidence of central vascular congestion. Interlobular septal thickening bilaterally. Blunting of the left costophrenic angle and right costophrenic angle. Meniscus on the lateral view within the costophrenic sulcus. No displaced fracture. IMPRESSION: Evidence of pulmonary edema and small bilateral pleural effusions. Electronically Signed   By: Gilmer Mor D.O.   On: 05/19/2018 19:30   Ct Chest Wo Contrast  Result Date: 05/19/2018 CLINICAL DATA:  82 y/o M; worsening shortness of breath and hypoxemia. Recent bilateral thoracentesis. History of COPD on home oxygen. EXAM: CT CHEST WITHOUT CONTRAST TECHNIQUE: Multidetector CT imaging of the chest was performed following the standard  protocol without IV contrast. COMPARISON:  05/19/2018 chest radiograph FINDINGS: Cardiovascular: Normal caliber thoracic aorta with mild calcific atherosclerosis. Mild coronary artery calcific atherosclerosis. Normal heart size. No pericardial effusion. Enlarged main pulmonary artery to 3.8 cm compatible pulmonary artery hypertension. Mediastinum/Nodes: Normal thyroid gland. Normal thoracic esophagus and trachea. AP window, lower paratracheal, and subcarinal lymphadenopathy. The largest right lower paratracheal lymph node measures 18 x 25 mm. Lungs/Pleura: Moderate right and small left pleural effusions with dependent atelectasis of the lower lobes. Mild centrilobular emphysema of the lungs. Smooth interlobular septal thickening compatible with interstitial edema. No consolidation. No pneumothorax. Upper Abdomen: Moderate volume of upper abdominal ascites, partially visualized. Musculoskeletal: No chest wall mass or suspicious bone lesions identified. Subcutaneous edema compatible with third spacing. IMPRESSION: 1. Interstitial pulmonary edema, mild emphysema, moderate right pleural effusion, and small left pleural effusion. 2. Moderate volume of upper abdominal ascites and subcutaneous edema. 3. Mild coronary and aortic calcific atherosclerosis. 4. Enlarged pulmonary artery compatible with pulmonary artery hypertension. 5. AP window and lower paratracheal lymphadenopathy, probably related to pulmonary vascular congestion. Electronically Signed   By: Mitzi Hansen M.D.   On: 05/19/2018 22:48   Dg Chest Port 1 View  Result Date: 05/21/2018 CLINICAL DATA:  Pulmonary edema. EXAM: PORTABLE CHEST 1 VIEW COMPARISON:  05/20/2018; 05/19/2018; chest CT-05/19/2018; ultrasound-guided right-sided thoracentesis - 05/20/2018 FINDINGS: Grossly unchanged cardiac silhouette and mediastinal contours. The pulmonary vasculature remains indistinct with cephalization of flow. Grossly unchanged trace pleural effusions and  associated bibasilar opacities, likely atelectasis. No new focal airspace opacities. No pneumothorax. No acute osseus abnormalities. IMPRESSION: Similar findings of cardiomegaly, mild pulmonary edema, trace pleural effusions and associated bibasilar opacities, likely atelectasis. Electronically Signed   By: Simonne Come M.D.   On: 05/21/2018 14:23   Dg Chest Port 1 View  Result Date: 05/20/2018 CLINICAL DATA:  Post RIGHT thoracentesis EXAM: PORTABLE CHEST 1 VIEW COMPARISON:  Portable exam 0959 hours compared to 05/19/2018 FINDINGS: Enlargement of cardiac silhouette with vascular congestion. Minimal pulmonary edema. Decreased RIGHT pleural effusion and basilar atelectasis. No pneumothorax post thoracentesis. Bones demineralized. IMPRESSION: No pneumothorax following RIGHT thoracentesis. Electronically Signed   By: Ulyses Southward M.D.   On: 05/20/2018 10:17   US Thoracentesis Asp Pleural Space W/img Guide  Result Date: 05/20/2018 INDICATION: Recurrent RIGHT pleural effusion EXAM: ULTRASOUND GUIDED DIAGNOSTIC AND THERAPEUTIC RIGHT THORACENTESIS MEDICATIONS: None. COMPLICATIONS: None immediate. PROCEDURE: Procedure, benefits, and risks of procedure were discussed with patient. Written informed consent for procedure was obtained. Time out protocol followed. Pleural effusion localized by ultrasound at the posterior RIGHT hemithorax. Skin prepped and draped in usual sterile fashion. Skin and soft tissues anesthetized with 10 mL of 1% lidocaine. 8 French thoracentesis catheter placed into the RIGHT pleural space. 1.2 L of clear yellow RIGHT pleural fluid  aspirated by syringe pump. Procedure tolerated very well by patient without immediate complication. FINDINGS: A total of approximately 1.2 L of RIGHT pleural fluid was removed. Samples were sent to the laboratory as requested by the clinical team. IMPRESSION: Successful ultrasound guided RIGHT thoracentesis yielding 1.2 L of pleural fluid. Electronically Signed   By:  Ulyses Southward M.D.   On: 05/20/2018 11:21       Subjective: Reports his breathing to be fine.  Denies any chest discomfort.  Persistent on going home.  Discharge Exam: Vitals:   05/24/18 0639 05/24/18 1241  BP: (!) 83/52   Pulse: 62   Resp: 19   Temp: 97.9 F (36.6 C)   SpO2: 90% 92%   Vitals:   05/24/18 0627 05/24/18 0630 05/24/18 0639 05/24/18 1241  BP:   (!) 83/52   Pulse:   62   Resp:   19   Temp:   97.9 F (36.6 C)   TempSrc:   Oral   SpO2: (!) 86% 92% 90% 92%  Weight:   95.5 kg (210 lb 8.6 oz)   Height:        General: Elderly male fatigued, not in distress HEENT: No pallor, moist mucosa, no JVD Chest: Clear to auscultation bilaterally CVS: S1-S2 regular, no murmurs GI: Soft, nondistended, nontender Muscular skeletal: Warm, no edema CNS: Alert and oriented     The results of significant diagnostics from this hospitalization (including imaging, microbiology, ancillary and laboratory) are listed below for reference.     Microbiology: Recent Results (from the past 240 hour(s))  Urine culture     Status: None   Collection Time: 05/19/18  7:29 PM  Result Value Ref Range Status   Specimen Description   Final    URINE, RANDOM Performed at [facility], [address], [place], [place] [address]    Special Requests   Final    NONE Performed at [facility], [address], [place], [place] [address]    Culture   Final    NO GROWTH Performed at [facility], [address] [place], [place] [address]    Report Status 05/21/2018 FINAL  Final  Blood Culture (routine x 2)     Status: None   Collection Time: 05/19/18  7:48 PM  Result Value Ref Range Status   Specimen Description LEFT ANTECUBITAL  Final   Special Requests   Final    BOTTLES DRAWN AEROBIC AND ANAEROBIC Blood Culture adequate volume   Culture   Final    NO GROWTH 5 DAYS Performed at [facility], [address] [place], [place] [address]    Report Status 05/24/2018 FINAL  Final   Blood Culture (routine x 2)     Status: None   Collection Time: 05/19/18  7:50 PM  Result Value Ref Range Status   Specimen Description RIGHT ANTECUBITAL  Final   Special Requests   Final    BOTTLES DRAWN AEROBIC AND ANAEROBIC Blood Culture adequate volume   Culture   Final    NO GROWTH 5 DAYS Performed at [facility], [address] [place], [place] [address]    Report Status 05/24/2018 FINAL  Final  MRSA PCR Screening     Status: None   Collection Time: 05/19/18 10:35 PM  Result Value Ref Range Status   MRSA by PCR NEGATIVE NEGATIVE Final    Comment:        The GeneXpert MRSA Assay (FDA approved for NASAL specimens only), is one component of  a comprehensive MRSA colonization surveillance program. It is not intended to diagnose MRSA infection nor to guide or monitor treatment for MRSA infections. Performed at [facility], [address], [place], [place] [address]   Gram stain     Status: None   Collection Time: 05/20/18  9:30 AM  Result Value Ref Range Status   Specimen Description PLEURAL  Final   Special Requests NONE  Final   Gram Stain   Final    CYTOSPIN SMEAR NO ORGANISMS SEEN WBC PRESENT, PREDOMINANTLY MONONUCLEAR Performed at [facility], [address] [place], [place] [address]    Report Status 05/20/2018 FINAL  Final  Culture, body fluid-bottle     Status: None (Preliminary result)   Collection Time: 05/20/18  9:30 AM  Result Value Ref Range Status   Specimen Description PLEURAL  Final   Special Requests BOTTLES DRAWN AEROBIC AND ANAEROBIC 10 CC EACH  Final   Culture   Final    NO GROWTH 4 DAYS Performed at [facility], [address], [place], [place] [address]    Report Status PENDING  Incomplete     Labs: BNP (last 3 results) Recent Labs    05/19/18 1806  BNP 1,047.0*   Basic Metabolic Panel: Recent Labs  Lab 05/19/18 1806 05/20/18 0355 05/21/18 0405 05/22/18 0716  NA 136 136 138 138  K 5.3* 4.8 3.9 3.4*  CL 99 99 97* 100  CO2  20* 24 31 29   GLUCOSE 142* 168* 99 87  BUN 56* 56* 55* 48*  CREATININE 1.76* 1.62* 1.48* 1.19  CALCIUM 8.8* 8.7* 8.4* 7.6*  MG  --  2.0 1.9  --    Liver Function Tests: Recent Labs  Lab 05/19/18 1806 05/21/18 0405 05/22/18 0716  AST 24 19 22   ALT 18 17 24   ALKPHOS 55 49 53  BILITOT 0.9 1.2 1.0  PROT 6.5 5.7* 5.2*  ALBUMIN 3.8 3.3* 3.0*   No results for input(s): LIPASE, AMYLASE in the last 168 hours. No results for input(s): AMMONIA in the last 168 hours. CBC: Recent Labs  Lab 05/19/18 1806 05/20/18 0355 05/21/18 0405 05/22/18 0716  WBC 14.3* 10.1 12.4* 10.3  NEUTROABS 13.2*  --  10.8* 9.2*  HGB 12.4* 11.9* 11.8* 11.8*  HCT 43.4 40.7 40.5 40.5  MCV 68.8* 67.3* 67.3* 67.6*  PLT 252 225 199 160   Cardiac Enzymes: Recent Labs  Lab 05/19/18 1806 05/19/18 1951 05/20/18 0355  TROPONINI 0.03* 0.03* 0.03*   BNP: Invalid input(s): POCBNP CBG: Recent Labs  Lab 05/23/18 1126 05/23/18 1640 05/23/18 2227 05/24/18 0755 05/24/18 1139  GLUCAP 114* 256* 142* 188* 156*   D-Dimer No results for input(s): DDIMER in the last 72 hours. Hgb A1c No results for input(s): HGBA1C in the last 72 hours. Lipid Profile No results for input(s): CHOL, HDL, LDLCALC, TRIG, CHOLHDL, LDLDIRECT in the last 72 hours. Thyroid function studies No results for input(s): TSH, T4TOTAL, T3FREE, THYROIDAB in the last 72 hours.  Invalid input(s): FREET3 Anemia work up No results for input(s): VITAMINB12, FOLATE, FERRITIN, TIBC, IRON, RETICCTPCT in the last 72 hours. Urinalysis    Component Value Date/Time   COLORURINE YELLOW 05/19/2018 1929   APPEARANCEUR CLEAR 05/19/2018 1929   LABSPEC 1.010 05/19/2018 1929   PHURINE 5.0 05/19/2018 1929   GLUCOSEU NEGATIVE 05/19/2018 1929   HGBUR NEGATIVE 05/19/2018 1929   BILIRUBINUR NEGATIVE 05/19/2018 1929   KETONESUR NEGATIVE 05/19/2018 1929   PROTEINUR NEGATIVE 05/19/2018 1929   NITRITE NEGATIVE 05/19/2018 1929  LEUKOCYTESUR NEGATIVE  05/19/2018 1929   Sepsis Labs Invalid input(s): PROCALCITONIN,  WBC,  LACTICIDVEN Microbiology Recent Results (from the past 240 hour(s))  Urine culture     Status: None   Collection Time: 05/19/18  7:29 PM  Result Value Ref Range Status   Specimen Description   Final    URINE, RANDOM Performed at [facility], [address], [place], [place] [address]    Special Requests   Final    NONE Performed at [facility], [address] [place], [place] [address]    Culture   Final    NO GROWTH Performed at [facility], [address], [place], [place] [address]    Report Status 05/21/2018 FINAL  Final  Blood Culture (routine x 2)     Status: None   Collection Time: 05/19/18  7:48 PM  Result Value Ref Range Status   Specimen Description LEFT ANTECUBITAL  Final   Special Requests   Final    BOTTLES DRAWN AEROBIC AND ANAEROBIC Blood Culture adequate volume   Culture   Final    NO GROWTH 5 DAYS Performed at [facility], [address] [place], [place] [address]    Report Status 05/24/2018 FINAL  Final  Blood Culture (routine x 2)     Status: None   Collection Time: 05/19/18  7:50 PM  Result Value Ref Range Status   Specimen Description RIGHT ANTECUBITAL  Final   Special Requests   Final    BOTTLES DRAWN AEROBIC AND ANAEROBIC Blood Culture adequate volume   Culture   Final    NO GROWTH 5 DAYS Performed at [facility], [address] [place], [place] [address]    Report Status 05/24/2018 FINAL  Final  MRSA PCR Screening     Status: None   Collection Time: 05/19/18 10:35 PM  Result Value Ref Range Status   MRSA by PCR NEGATIVE NEGATIVE Final    Comment:        The GeneXpert MRSA Assay (FDA approved for NASAL specimens only), is one component of a comprehensive MRSA colonization surveillance program. It is not intended to diagnose MRSA infection nor to guide or monitor treatment for MRSA infections. Performed at [facility], [address], [place], [place]  [address]   Gram stain     Status: None   Collection Time: 05/20/18  9:30 AM  Result Value Ref Range Status   Specimen Description PLEURAL  Final   Special Requests NONE  Final   Gram Stain   Final    CYTOSPIN SMEAR NO ORGANISMS SEEN WBC PRESENT, PREDOMINANTLY MONONUCLEAR Performed at [facility], [address] [place] [address] [place], [place] [address]    Report Status 05/20/2018 FINAL  Final  Culture, body fluid-bottle     Status: None (Preliminary result)   Collection Time: 05/20/18  9:30 AM  Result Value Ref Range Status   Specimen Description PLEURAL  Final   Special Requests BOTTLES DRAWN AEROBIC AND ANAEROBIC 10 CC EACH  Final   Culture   Final    NO GROWTH 4 DAYS Performed at [facility], [address] [place], [place] [address]    Report Status PENDING  Incomplete     Time coordinating discharge: 35 minutes  SIGNED:   Eddie North, MD  [facility] 05/24/2018, 2:22 PM Pager   If 7PM-7AM, please contact night-coverage [email or web address] Password [email or web address]

## 2018-05-25 LAB — BLOOD GAS, ARTERIAL
BICARBONATE: 20.8 mmol/L (ref 20.0–28.0)
Drawn by: 270161
O2 Content: 5 L/min
O2 Saturation: 75.6 %
pCO2 arterial: 31.7 mmHg — ABNORMAL LOW (ref 32.0–48.0)
pH, Arterial: 7.406 (ref 7.350–7.450)
pO2, Arterial: 45.9 mmHg — ABNORMAL LOW (ref 83.0–108.0)

## 2018-05-25 LAB — CULTURE, BODY FLUID-BOTTLE: CULTURE: NO GROWTH

## 2018-05-25 LAB — CULTURE, BODY FLUID W GRAM STAIN -BOTTLE

## 2018-06-03 DEATH — deceased

## 2020-04-01 IMAGING — CT CT CHEST W/O CM
2 of 3 series · 15 of 36 positions shown, 18 images · non-contrast
Comparison: 05/19/2018 chest radiograph

CLINICAL DATA: 81 y/o M; worsening shortness of breath and
hypoxemia. Recent bilateral thoracentesis. History of COPD on home
oxygen.

EXAM:
CT CHEST WITHOUT CONTRAST
TECHNIQUE: Multidetector CT imaging of the chest was performed following the
standard protocol without IV contrast.

[Series 2: thorax · axial · 0.77mm/px · z∈[+1146,+1436]mm · 12 of 171 slices shown, 15 images]
[im 13/171  mediastinal]
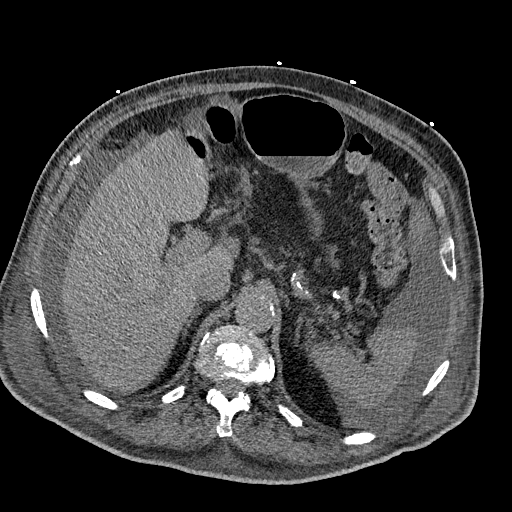
[im 13/171  lung]
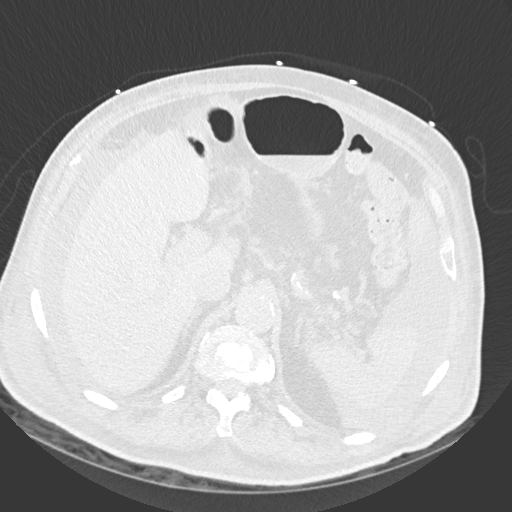
[im 26/171  lung]
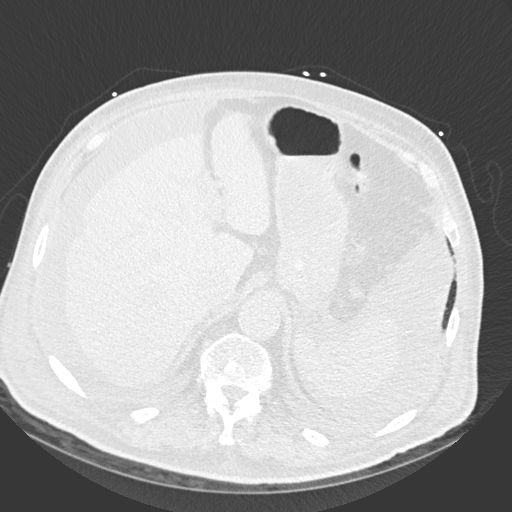
[im 38/171  lung]
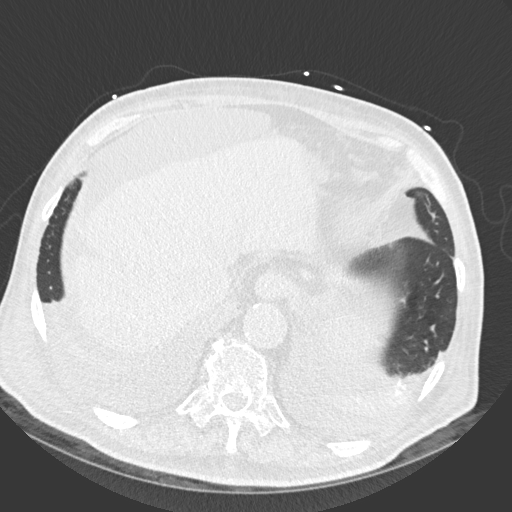
[im 51/171  lung]
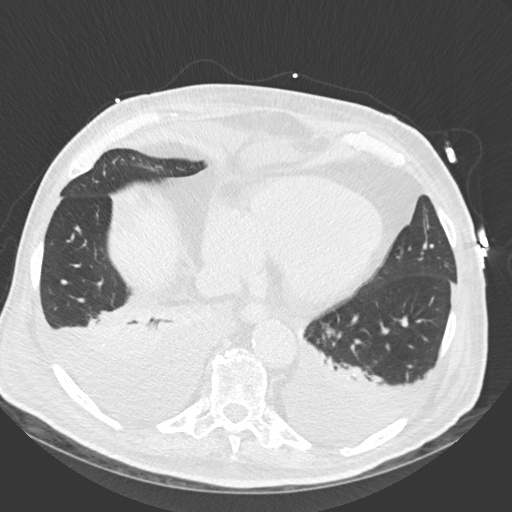
[im 63/171  mediastinal]
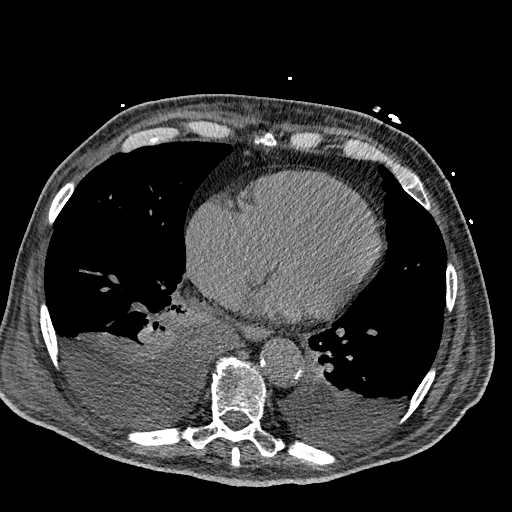
[im 63/171  lung]
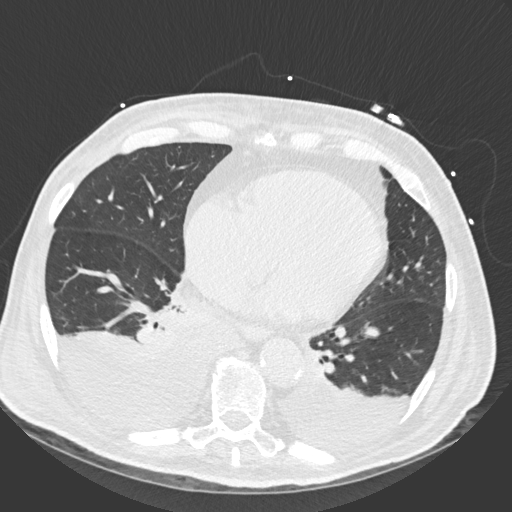
[im 76/171  lung]
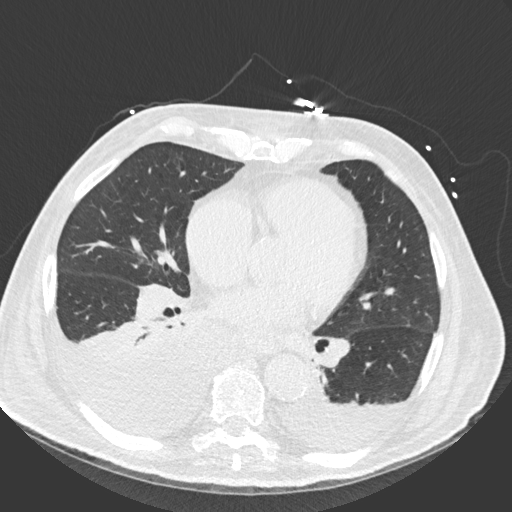
[im 95/171  lung]
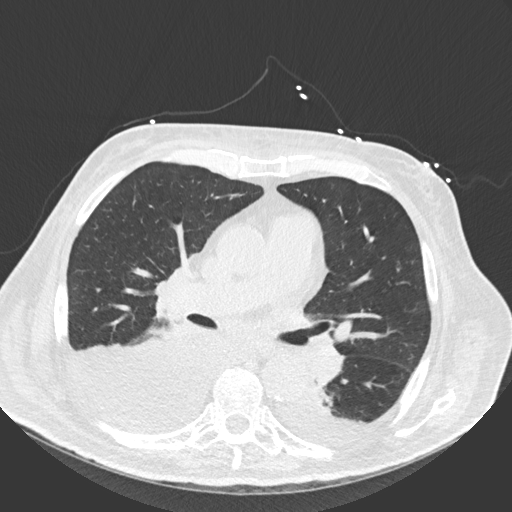
[im 108/171  lung]
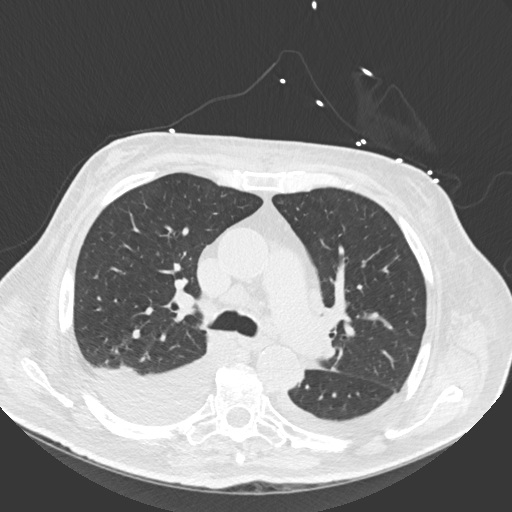
[im 120/171  mediastinal]
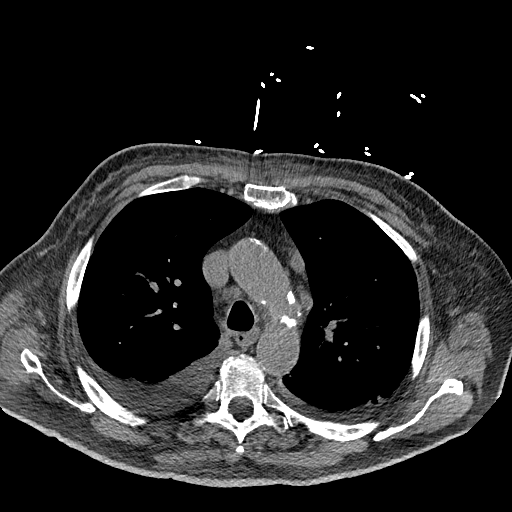
[im 120/171  lung]
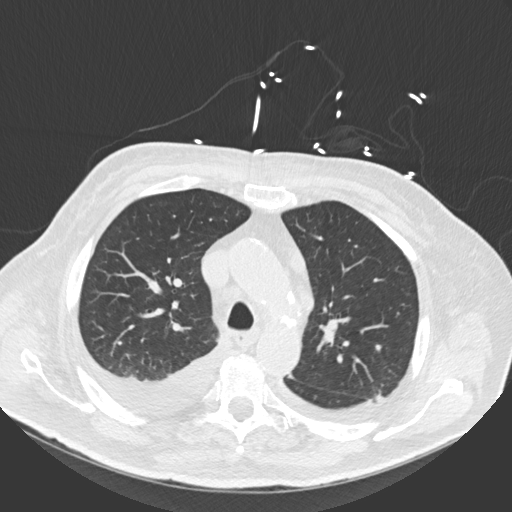
[im 133/171  lung]
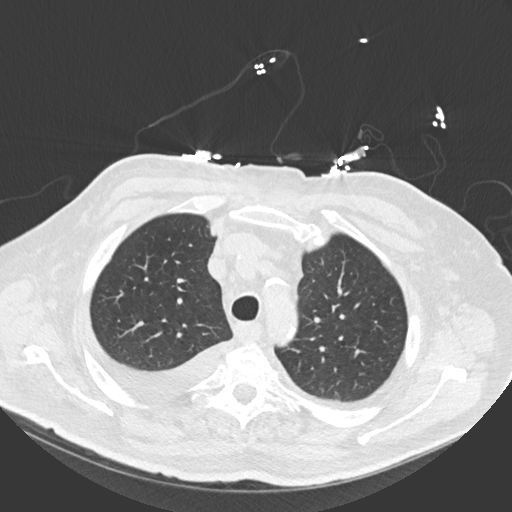
[im 145/171  lung]
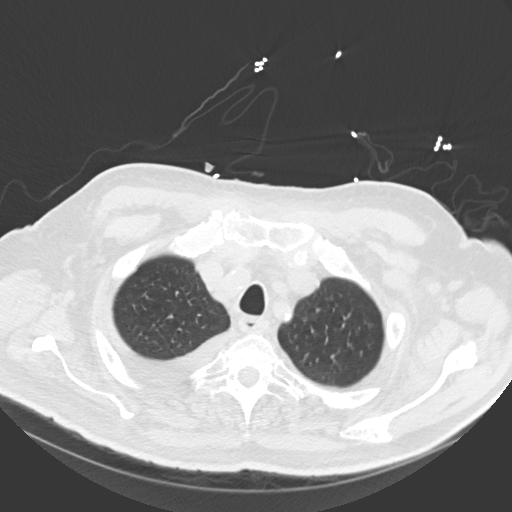
[im 158/171  lung]
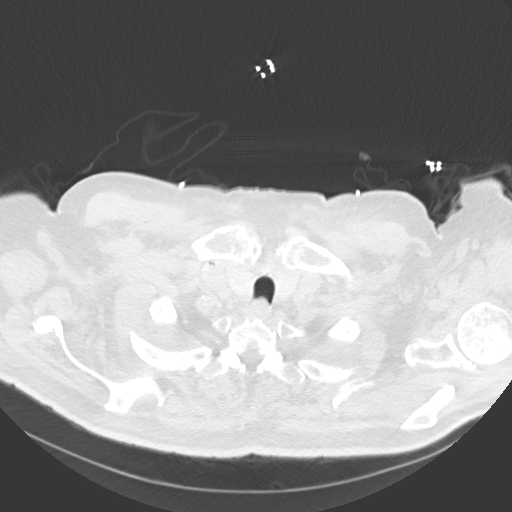

[Series 5: coronal · coronal · 0.71mm/px · 3 of 165 slices shown]
[im 33/165  lung]
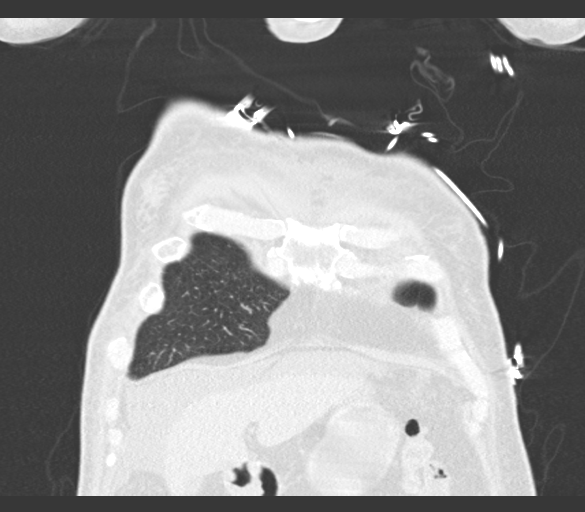
[im 66/165  lung]
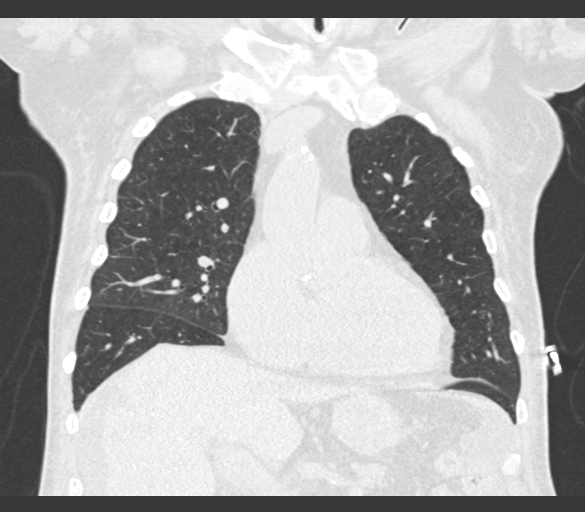
[im 99/165  lung]
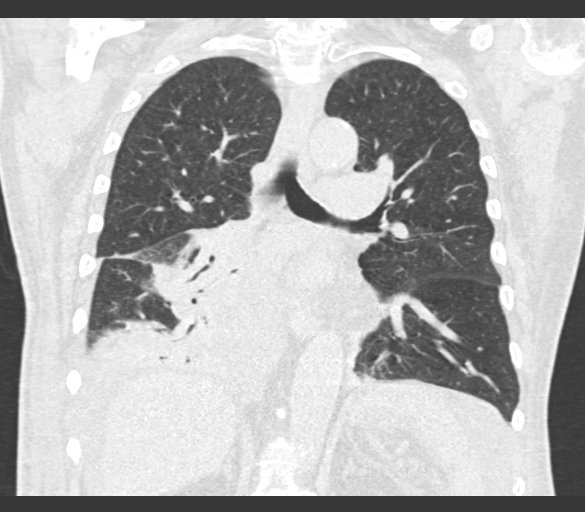

[15 of 36 positions shown; findings below may reference images not displayed]

FINDINGS: Cardiovascular: Normal caliber thoracic aorta with mild calcific
atherosclerosis. Mild coronary artery calcific atherosclerosis.
Normal heart size. No pericardial effusion. Enlarged main pulmonary
artery to 3.8 cm compatible pulmonary artery hypertension.

Mediastinum/Nodes: Normal thyroid gland. Normal thoracic esophagus
and trachea. AP window, lower paratracheal, and subcarinal
lymphadenopathy. The largest right lower paratracheal lymph node
measures 18 x 25 mm.

Lungs/Pleura: Moderate right and small left pleural effusions with
dependent atelectasis of the lower lobes. Mild centrilobular
emphysema of the lungs. Smooth interlobular septal thickening
compatible with interstitial edema. No consolidation. No
pneumothorax.

Upper Abdomen: Moderate volume of upper abdominal ascites, partially
visualized.

Musculoskeletal: No chest wall mass or suspicious bone lesions
identified. Subcutaneous edema compatible with third spacing.
IMPRESSION: 1. Interstitial pulmonary edema, mild emphysema, moderate right
pleural effusion, and small left pleural effusion.
2. Moderate volume of upper abdominal ascites and subcutaneous
edema.
3. Mild coronary and aortic calcific atherosclerosis.
4. Enlarged pulmonary artery compatible with pulmonary artery
hypertension.
5. AP window and lower paratracheal lymphadenopathy, probably
related to pulmonary vascular congestion.

By: Akuume Kempu M.D.

## 2020-04-02 IMAGING — US US THORACENTESIS ASP PLEURAL SPACE W/IMG GUIDE
1 series · 1 of 1 positions shown · non-contrast
Comparison: none

INDICATION: Recurrent RIGHT pleural effusion

[Series 1: us thoracentesis asp pleural space w/img guide · 0.25mm/px · 1 of 1 slices shown]
[im 1/1]
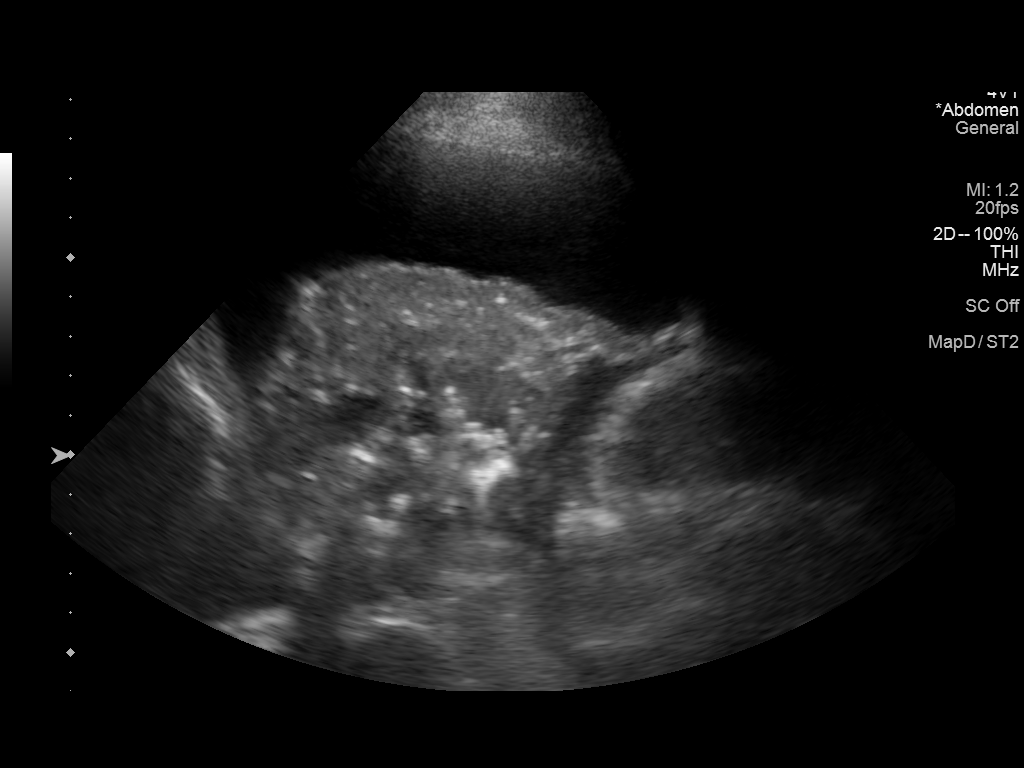

[1 of 1 positions shown; findings below may reference images not displayed]

EXAM:
ULTRASOUND GUIDED DIAGNOSTIC AND THERAPEUTIC RIGHT THORACENTESIS

MEDICATIONS:
None.

COMPLICATIONS:
None immediate.

PROCEDURE:
Procedure, benefits, and risks of procedure were discussed with
patient.

Written informed consent for procedure was obtained.

Time out protocol followed.

Pleural effusion localized by ultrasound at the posterior RIGHT
hemithorax.

Skin prepped and draped in usual sterile fashion.

Skin and soft tissues anesthetized with 10 mL of 1% lidocaine.

8 French thoracentesis catheter placed into the RIGHT pleural space.

1.2 L of clear yellow RIGHT pleural fluid aspirated by syringe pump.

Procedure tolerated very well by patient without immediate
complication.
FINDINGS: A total of approximately 1.2 L of RIGHT pleural fluid was removed.

Samples were sent to the laboratory as requested by the clinical
team.
IMPRESSION: Successful ultrasound guided RIGHT thoracentesis yielding 1.2 L of
pleural fluid.
